# Patient Record
Sex: Female | Born: 1935 | Race: White | Hispanic: No | Smoking: Never smoker
Health system: Southern US, Community
[De-identification: ages and names within clinical notes are randomized; demographics above are authoritative.]

## PROBLEM LIST (undated history)

## (undated) DIAGNOSIS — N390 Urinary tract infection, site not specified: Secondary | ICD-10-CM

## (undated) DIAGNOSIS — J449 Chronic obstructive pulmonary disease, unspecified: Secondary | ICD-10-CM

## (undated) DIAGNOSIS — R55 Syncope and collapse: Secondary | ICD-10-CM

## (undated) DIAGNOSIS — I10 Essential (primary) hypertension: Secondary | ICD-10-CM

## (undated) DIAGNOSIS — R918 Other nonspecific abnormal finding of lung field: Secondary | ICD-10-CM

## (undated) DIAGNOSIS — R001 Bradycardia, unspecified: Secondary | ICD-10-CM

## (undated) DIAGNOSIS — R0600 Dyspnea, unspecified: Secondary | ICD-10-CM

## (undated) DIAGNOSIS — R531 Weakness: Secondary | ICD-10-CM

## (undated) DIAGNOSIS — K219 Gastro-esophageal reflux disease without esophagitis: Secondary | ICD-10-CM

## (undated) DIAGNOSIS — F0390 Unspecified dementia without behavioral disturbance: Secondary | ICD-10-CM

## (undated) DIAGNOSIS — E119 Type 2 diabetes mellitus without complications: Secondary | ICD-10-CM

## (undated) DIAGNOSIS — I4891 Unspecified atrial fibrillation: Secondary | ICD-10-CM

## (undated) DIAGNOSIS — M1990 Unspecified osteoarthritis, unspecified site: Secondary | ICD-10-CM

## (undated) DIAGNOSIS — D72810 Lymphocytopenia: Secondary | ICD-10-CM

## (undated) HISTORY — PX: TONSILLECTOMY: SUR1361

## (undated) HISTORY — PX: CHOLECYSTECTOMY: SHX55

## (undated) HISTORY — PX: HYSTERECTOMY ABDOMINAL WITH SALPINGECTOMY: SHX6725

---

## 2011-03-08 DIAGNOSIS — M542 Cervicalgia: Secondary | ICD-10-CM | POA: Insufficient documentation

## 2011-03-08 DIAGNOSIS — M2550 Pain in unspecified joint: Secondary | ICD-10-CM | POA: Insufficient documentation

## 2011-03-08 DIAGNOSIS — M549 Dorsalgia, unspecified: Secondary | ICD-10-CM | POA: Insufficient documentation

## 2012-06-08 DIAGNOSIS — G894 Chronic pain syndrome: Secondary | ICD-10-CM | POA: Insufficient documentation

## 2012-06-08 DIAGNOSIS — Z79891 Long term (current) use of opiate analgesic: Secondary | ICD-10-CM | POA: Insufficient documentation

## 2015-04-17 DIAGNOSIS — M12561 Traumatic arthropathy, right knee: Secondary | ICD-10-CM | POA: Diagnosis not present

## 2015-04-25 DIAGNOSIS — R0902 Hypoxemia: Secondary | ICD-10-CM | POA: Diagnosis not present

## 2015-05-22 DIAGNOSIS — R079 Chest pain, unspecified: Secondary | ICD-10-CM | POA: Diagnosis not present

## 2015-05-22 DIAGNOSIS — R0789 Other chest pain: Secondary | ICD-10-CM | POA: Diagnosis not present

## 2015-05-22 DIAGNOSIS — R0602 Shortness of breath: Secondary | ICD-10-CM | POA: Diagnosis not present

## 2015-05-26 DIAGNOSIS — R0902 Hypoxemia: Secondary | ICD-10-CM | POA: Diagnosis not present

## 2015-05-27 DIAGNOSIS — K219 Gastro-esophageal reflux disease without esophagitis: Secondary | ICD-10-CM | POA: Diagnosis not present

## 2015-05-27 DIAGNOSIS — Z23 Encounter for immunization: Secondary | ICD-10-CM | POA: Diagnosis not present

## 2015-05-27 DIAGNOSIS — F0390 Unspecified dementia without behavioral disturbance: Secondary | ICD-10-CM | POA: Diagnosis not present

## 2015-06-23 DIAGNOSIS — R0902 Hypoxemia: Secondary | ICD-10-CM | POA: Diagnosis not present

## 2015-07-24 DIAGNOSIS — R0902 Hypoxemia: Secondary | ICD-10-CM | POA: Diagnosis not present

## 2015-08-23 DIAGNOSIS — R0902 Hypoxemia: Secondary | ICD-10-CM | POA: Diagnosis not present

## 2015-09-04 DIAGNOSIS — M7501 Adhesive capsulitis of right shoulder: Secondary | ICD-10-CM | POA: Diagnosis not present

## 2015-09-04 DIAGNOSIS — M7502 Adhesive capsulitis of left shoulder: Secondary | ICD-10-CM | POA: Diagnosis not present

## 2015-09-04 DIAGNOSIS — Z79899 Other long term (current) drug therapy: Secondary | ICD-10-CM | POA: Diagnosis not present

## 2015-09-04 DIAGNOSIS — E1140 Type 2 diabetes mellitus with diabetic neuropathy, unspecified: Secondary | ICD-10-CM | POA: Diagnosis not present

## 2015-09-18 DIAGNOSIS — M19012 Primary osteoarthritis, left shoulder: Secondary | ICD-10-CM | POA: Diagnosis not present

## 2015-09-23 DIAGNOSIS — R0902 Hypoxemia: Secondary | ICD-10-CM | POA: Diagnosis not present

## 2015-09-23 DIAGNOSIS — H26492 Other secondary cataract, left eye: Secondary | ICD-10-CM | POA: Diagnosis not present

## 2015-09-23 DIAGNOSIS — H53001 Unspecified amblyopia, right eye: Secondary | ICD-10-CM | POA: Diagnosis not present

## 2015-09-23 DIAGNOSIS — H25811 Combined forms of age-related cataract, right eye: Secondary | ICD-10-CM | POA: Diagnosis not present

## 2015-10-16 DIAGNOSIS — M170 Bilateral primary osteoarthritis of knee: Secondary | ICD-10-CM | POA: Diagnosis not present

## 2015-10-16 DIAGNOSIS — M19012 Primary osteoarthritis, left shoulder: Secondary | ICD-10-CM | POA: Diagnosis not present

## 2015-10-20 DIAGNOSIS — M4800 Spinal stenosis, site unspecified: Secondary | ICD-10-CM | POA: Diagnosis not present

## 2015-10-20 DIAGNOSIS — R1013 Epigastric pain: Secondary | ICD-10-CM | POA: Diagnosis not present

## 2015-10-20 DIAGNOSIS — Z1389 Encounter for screening for other disorder: Secondary | ICD-10-CM | POA: Diagnosis not present

## 2015-10-20 DIAGNOSIS — M159 Polyosteoarthritis, unspecified: Secondary | ICD-10-CM | POA: Diagnosis not present

## 2015-10-20 DIAGNOSIS — G8929 Other chronic pain: Secondary | ICD-10-CM | POA: Diagnosis not present

## 2015-10-23 DIAGNOSIS — R0902 Hypoxemia: Secondary | ICD-10-CM | POA: Diagnosis not present

## 2015-11-05 DIAGNOSIS — K219 Gastro-esophageal reflux disease without esophagitis: Secondary | ICD-10-CM | POA: Diagnosis not present

## 2015-11-23 DIAGNOSIS — R0902 Hypoxemia: Secondary | ICD-10-CM | POA: Diagnosis not present

## 2015-12-04 DIAGNOSIS — K219 Gastro-esophageal reflux disease without esophagitis: Secondary | ICD-10-CM | POA: Diagnosis not present

## 2015-12-09 DIAGNOSIS — Z0000 Encounter for general adult medical examination without abnormal findings: Secondary | ICD-10-CM | POA: Diagnosis not present

## 2015-12-09 DIAGNOSIS — M159 Polyosteoarthritis, unspecified: Secondary | ICD-10-CM | POA: Diagnosis not present

## 2015-12-09 DIAGNOSIS — R001 Bradycardia, unspecified: Secondary | ICD-10-CM | POA: Diagnosis not present

## 2015-12-24 DIAGNOSIS — R0902 Hypoxemia: Secondary | ICD-10-CM | POA: Diagnosis not present

## 2015-12-26 DIAGNOSIS — I10 Essential (primary) hypertension: Secondary | ICD-10-CM | POA: Diagnosis not present

## 2015-12-26 DIAGNOSIS — R001 Bradycardia, unspecified: Secondary | ICD-10-CM | POA: Diagnosis not present

## 2015-12-26 DIAGNOSIS — R42 Dizziness and giddiness: Secondary | ICD-10-CM | POA: Insufficient documentation

## 2015-12-29 DIAGNOSIS — R1084 Generalized abdominal pain: Secondary | ICD-10-CM | POA: Diagnosis not present

## 2015-12-29 DIAGNOSIS — Z79899 Other long term (current) drug therapy: Secondary | ICD-10-CM | POA: Diagnosis not present

## 2015-12-29 DIAGNOSIS — I10 Essential (primary) hypertension: Secondary | ICD-10-CM | POA: Diagnosis not present

## 2015-12-29 DIAGNOSIS — Z8679 Personal history of other diseases of the circulatory system: Secondary | ICD-10-CM | POA: Diagnosis not present

## 2015-12-29 DIAGNOSIS — K5730 Diverticulosis of large intestine without perforation or abscess without bleeding: Secondary | ICD-10-CM | POA: Diagnosis not present

## 2015-12-29 DIAGNOSIS — R109 Unspecified abdominal pain: Secondary | ICD-10-CM | POA: Diagnosis not present

## 2016-01-06 DIAGNOSIS — R42 Dizziness and giddiness: Secondary | ICD-10-CM | POA: Diagnosis not present

## 2016-01-06 DIAGNOSIS — I10 Essential (primary) hypertension: Secondary | ICD-10-CM | POA: Diagnosis not present

## 2016-01-06 DIAGNOSIS — R001 Bradycardia, unspecified: Secondary | ICD-10-CM | POA: Diagnosis not present

## 2016-01-08 DIAGNOSIS — R42 Dizziness and giddiness: Secondary | ICD-10-CM | POA: Diagnosis not present

## 2016-01-08 DIAGNOSIS — R001 Bradycardia, unspecified: Secondary | ICD-10-CM | POA: Diagnosis not present

## 2016-01-08 DIAGNOSIS — I10 Essential (primary) hypertension: Secondary | ICD-10-CM | POA: Diagnosis not present

## 2016-01-23 DIAGNOSIS — R0902 Hypoxemia: Secondary | ICD-10-CM | POA: Diagnosis not present

## 2016-01-29 DIAGNOSIS — R531 Weakness: Secondary | ICD-10-CM | POA: Diagnosis not present

## 2016-01-29 DIAGNOSIS — J189 Pneumonia, unspecified organism: Secondary | ICD-10-CM | POA: Diagnosis not present

## 2016-01-29 DIAGNOSIS — R0602 Shortness of breath: Secondary | ICD-10-CM | POA: Diagnosis not present

## 2016-01-29 DIAGNOSIS — R0902 Hypoxemia: Secondary | ICD-10-CM | POA: Diagnosis not present

## 2016-01-29 DIAGNOSIS — N390 Urinary tract infection, site not specified: Secondary | ICD-10-CM | POA: Diagnosis not present

## 2016-01-29 DIAGNOSIS — R05 Cough: Secondary | ICD-10-CM | POA: Diagnosis not present

## 2016-02-12 DIAGNOSIS — M159 Polyosteoarthritis, unspecified: Secondary | ICD-10-CM | POA: Diagnosis not present

## 2016-02-12 DIAGNOSIS — M4800 Spinal stenosis, site unspecified: Secondary | ICD-10-CM | POA: Diagnosis not present

## 2016-02-23 DIAGNOSIS — R0902 Hypoxemia: Secondary | ICD-10-CM | POA: Diagnosis not present

## 2016-03-16 DIAGNOSIS — H26492 Other secondary cataract, left eye: Secondary | ICD-10-CM | POA: Diagnosis not present

## 2016-03-16 DIAGNOSIS — H25811 Combined forms of age-related cataract, right eye: Secondary | ICD-10-CM | POA: Diagnosis not present

## 2016-03-24 DIAGNOSIS — R0902 Hypoxemia: Secondary | ICD-10-CM | POA: Diagnosis not present

## 2016-04-24 DIAGNOSIS — R0902 Hypoxemia: Secondary | ICD-10-CM | POA: Diagnosis not present

## 2016-05-12 DIAGNOSIS — M159 Polyosteoarthritis, unspecified: Secondary | ICD-10-CM | POA: Diagnosis not present

## 2016-05-12 DIAGNOSIS — M4800 Spinal stenosis, site unspecified: Secondary | ICD-10-CM | POA: Diagnosis not present

## 2016-05-12 DIAGNOSIS — G8929 Other chronic pain: Secondary | ICD-10-CM | POA: Diagnosis not present

## 2016-05-12 DIAGNOSIS — R52 Pain, unspecified: Secondary | ICD-10-CM | POA: Diagnosis not present

## 2016-05-12 DIAGNOSIS — Z79899 Other long term (current) drug therapy: Secondary | ICD-10-CM | POA: Diagnosis not present

## 2016-05-25 DIAGNOSIS — R0902 Hypoxemia: Secondary | ICD-10-CM | POA: Diagnosis not present

## 2016-06-22 DIAGNOSIS — R0902 Hypoxemia: Secondary | ICD-10-CM | POA: Diagnosis not present

## 2016-07-23 DIAGNOSIS — R0902 Hypoxemia: Secondary | ICD-10-CM | POA: Diagnosis not present

## 2016-08-11 DIAGNOSIS — G8929 Other chronic pain: Secondary | ICD-10-CM | POA: Diagnosis not present

## 2016-08-11 DIAGNOSIS — R52 Pain, unspecified: Secondary | ICD-10-CM | POA: Diagnosis not present

## 2016-08-11 DIAGNOSIS — K921 Melena: Secondary | ICD-10-CM | POA: Diagnosis not present

## 2016-08-22 DIAGNOSIS — R0902 Hypoxemia: Secondary | ICD-10-CM | POA: Diagnosis not present

## 2016-09-14 DIAGNOSIS — H40001 Preglaucoma, unspecified, right eye: Secondary | ICD-10-CM | POA: Diagnosis not present

## 2016-09-14 DIAGNOSIS — H53001 Unspecified amblyopia, right eye: Secondary | ICD-10-CM | POA: Diagnosis not present

## 2016-09-14 DIAGNOSIS — H26492 Other secondary cataract, left eye: Secondary | ICD-10-CM | POA: Diagnosis not present

## 2016-09-14 DIAGNOSIS — H2589 Other age-related cataract: Secondary | ICD-10-CM | POA: Diagnosis not present

## 2016-09-22 DIAGNOSIS — R0902 Hypoxemia: Secondary | ICD-10-CM | POA: Diagnosis not present

## 2016-10-20 DIAGNOSIS — S300XXA Contusion of lower back and pelvis, initial encounter: Secondary | ICD-10-CM | POA: Diagnosis not present

## 2016-10-20 DIAGNOSIS — L299 Pruritus, unspecified: Secondary | ICD-10-CM | POA: Diagnosis not present

## 2016-10-22 DIAGNOSIS — R0902 Hypoxemia: Secondary | ICD-10-CM | POA: Diagnosis not present

## 2016-11-22 DIAGNOSIS — R0902 Hypoxemia: Secondary | ICD-10-CM | POA: Diagnosis not present

## 2016-12-23 DIAGNOSIS — R0902 Hypoxemia: Secondary | ICD-10-CM | POA: Diagnosis not present

## 2016-12-28 ENCOUNTER — Encounter: Payer: Self-pay | Admitting: Podiatry

## 2016-12-28 ENCOUNTER — Ambulatory Visit (INDEPENDENT_AMBULATORY_CARE_PROVIDER_SITE_OTHER): Payer: Medicare Other | Admitting: Podiatry

## 2016-12-28 VITALS — BP 153/78 | HR 49 | Ht 63.0 in | Wt 155.0 lb

## 2016-12-28 DIAGNOSIS — M2042 Other hammer toe(s) (acquired), left foot: Secondary | ICD-10-CM

## 2016-12-28 DIAGNOSIS — M79609 Pain in unspecified limb: Secondary | ICD-10-CM

## 2016-12-28 DIAGNOSIS — Z0189 Encounter for other specified special examinations: Secondary | ICD-10-CM

## 2016-12-28 DIAGNOSIS — E119 Type 2 diabetes mellitus without complications: Secondary | ICD-10-CM

## 2016-12-28 DIAGNOSIS — M2041 Other hammer toe(s) (acquired), right foot: Secondary | ICD-10-CM | POA: Diagnosis not present

## 2016-12-28 DIAGNOSIS — B351 Tinea unguium: Secondary | ICD-10-CM | POA: Diagnosis not present

## 2016-12-28 NOTE — Progress Notes (Signed)
   Subjective:    Patient ID: Erika Rice, female    DOB: 10-Mar-1925, 83 y.o.   MRN: [id number]  HPI  I am diabetic and am having a lot of burning in my feet they are cold during the day and hot at night.   Reports diabetes but does not really check sugars.  PMH of DM, Anxiety No past surgical history on file.  Current Outpatient Prescriptions:  .  ALPRAZolam (XANAX) 0.25 MG tablet, , Disp: , Rfl:  .  Cholecalciferol (VITAMIN D3) 1000 units CAPS, Take by mouth., Disp: , Rfl:  .  donepezil (ARICEPT) 10 MG tablet, , Disp: , Rfl:  .  escitalopram (LEXAPRO) 20 MG tablet, , Disp: , Rfl:  .  famotidine (PEPCID) 20 MG tablet, , Disp: , Rfl:  .  fentaNYL (DURAGESIC - DOSED MCG/HR) 50 MCG/HR, , Disp: , Rfl:  .  LYRICA 75 MG capsule, , Disp: , Rfl:  .  oxyCODONE-acetaminophen (PERCOCET) 7.5-325 MG tablet, , Disp: , Rfl:  .  pantoprazole (PROTONIX) 40 MG tablet, , Disp: , Rfl:  .  risperiDONE (RISPERDAL) 0.25 MG tablet, , Disp: , Rfl:  .  tiZANidine (ZANAFLEX) 2 MG tablet, , Disp: , Rfl:  .  vitamin B-12 (CYANOCOBALAMIN) 1000 MCG tablet, Take by mouth., Disp: , Rfl:   Allergies  Allergen Reactions  . Ace Inhibitors Other (See Comments)    Does not recall   . Sulfamethoxazole Rash and Other (See Comments)    Does not recall      Review of Systems  Endocrine: Positive for cold intolerance.  Musculoskeletal: Positive for arthralgias and gait problem.  Neurological: Positive for dizziness and weakness.  All other systems reviewed and are negative.      Objective:   Physical Exam Vitals:   12/28/16 1542  BP: (!) 153/78  Pulse: (!) 49   General AA&O x3. Normal mood and affect.  Vascular Dorsalis pedis and posterior tibial pulses  present 1+ bilaterally  Capillary refill normal to all digits. Pedal hair growth diminished.  Neurologic Epicritic sensation present bilaterally. Protective sensation with 5.07 monofilament  4/5 bilaterally. Vibratory sensation present bilaterally.    Dermatologic No open lesions. Interspaces clear of maceration.  Normal skin temperature and turgor. Hyperkeratotic lesions: none bilaterally. Nails: brittle, elongated  Orthopedic: No history of amputation. Normal lower extremity joint ROM without pain or crepitus. HAV deformity bilat. Lesser digital contractures bilat.      Assessment & Plan:  DM without Complication; neurovascularly Intact -Nails debrided as below d/t pain. Does not meet criteria for routine foot care. -Diabetic Risk Level 1  Hammertoes Bilat feet. -Silicone gel sleeve dispensed. -Continue extra depth diabetic shoes.  Procedure: Nail Debridement Rationale: pain in toenails Type of Debridement: manual, sharp debridement. Instrumentation: Nail nipper  Number of Nails: 10

## 2016-12-28 NOTE — Patient Instructions (Signed)

## 2017-01-04 DIAGNOSIS — Z23 Encounter for immunization: Secondary | ICD-10-CM | POA: Diagnosis not present

## 2017-01-22 DIAGNOSIS — R0902 Hypoxemia: Secondary | ICD-10-CM | POA: Diagnosis not present

## 2017-01-26 DIAGNOSIS — Z7689 Persons encountering health services in other specified circumstances: Secondary | ICD-10-CM | POA: Diagnosis not present

## 2017-01-26 DIAGNOSIS — M159 Polyosteoarthritis, unspecified: Secondary | ICD-10-CM | POA: Diagnosis not present

## 2017-02-10 DIAGNOSIS — R52 Pain, unspecified: Secondary | ICD-10-CM | POA: Diagnosis not present

## 2017-02-10 DIAGNOSIS — G894 Chronic pain syndrome: Secondary | ICD-10-CM | POA: Diagnosis not present

## 2017-02-22 DIAGNOSIS — R0902 Hypoxemia: Secondary | ICD-10-CM | POA: Diagnosis not present

## 2017-03-24 DIAGNOSIS — R0902 Hypoxemia: Secondary | ICD-10-CM | POA: Diagnosis not present

## 2017-04-04 DIAGNOSIS — R52 Pain, unspecified: Secondary | ICD-10-CM | POA: Diagnosis not present

## 2017-04-04 DIAGNOSIS — M159 Polyosteoarthritis, unspecified: Secondary | ICD-10-CM | POA: Diagnosis not present

## 2017-04-04 DIAGNOSIS — Z79899 Other long term (current) drug therapy: Secondary | ICD-10-CM | POA: Diagnosis not present

## 2017-04-04 DIAGNOSIS — G8929 Other chronic pain: Secondary | ICD-10-CM | POA: Diagnosis not present

## 2017-04-04 DIAGNOSIS — H6983 Other specified disorders of Eustachian tube, bilateral: Secondary | ICD-10-CM | POA: Diagnosis not present

## 2017-04-09 DIAGNOSIS — Z950 Presence of cardiac pacemaker: Secondary | ICD-10-CM | POA: Diagnosis not present

## 2017-04-09 DIAGNOSIS — Z79899 Other long term (current) drug therapy: Secondary | ICD-10-CM | POA: Diagnosis not present

## 2017-04-09 DIAGNOSIS — N390 Urinary tract infection, site not specified: Secondary | ICD-10-CM | POA: Diagnosis not present

## 2017-04-09 DIAGNOSIS — T404X5A Adverse effect of other synthetic narcotics, initial encounter: Secondary | ICD-10-CM | POA: Diagnosis not present

## 2017-04-09 DIAGNOSIS — Z5329 Procedure and treatment not carried out because of patient's decision for other reasons: Secondary | ICD-10-CM | POA: Diagnosis not present

## 2017-04-09 DIAGNOSIS — J449 Chronic obstructive pulmonary disease, unspecified: Secondary | ICD-10-CM | POA: Diagnosis not present

## 2017-04-09 DIAGNOSIS — R0789 Other chest pain: Secondary | ICD-10-CM | POA: Diagnosis not present

## 2017-04-09 DIAGNOSIS — G4700 Insomnia, unspecified: Secondary | ICD-10-CM | POA: Diagnosis not present

## 2017-04-09 DIAGNOSIS — Z87891 Personal history of nicotine dependence: Secondary | ICD-10-CM | POA: Diagnosis not present

## 2017-04-09 DIAGNOSIS — R001 Bradycardia, unspecified: Secondary | ICD-10-CM | POA: Diagnosis not present

## 2017-04-09 DIAGNOSIS — Z79891 Long term (current) use of opiate analgesic: Secondary | ICD-10-CM | POA: Diagnosis not present

## 2017-04-09 DIAGNOSIS — T441X5A Adverse effect of other parasympathomimetics [cholinergics], initial encounter: Secondary | ICD-10-CM | POA: Diagnosis not present

## 2017-04-09 DIAGNOSIS — N3000 Acute cystitis without hematuria: Secondary | ICD-10-CM | POA: Diagnosis not present

## 2017-04-09 DIAGNOSIS — E1142 Type 2 diabetes mellitus with diabetic polyneuropathy: Secondary | ICD-10-CM | POA: Diagnosis not present

## 2017-04-09 DIAGNOSIS — R404 Transient alteration of awareness: Secondary | ICD-10-CM | POA: Diagnosis not present

## 2017-04-09 DIAGNOSIS — G8929 Other chronic pain: Secondary | ICD-10-CM | POA: Diagnosis not present

## 2017-04-09 DIAGNOSIS — R5381 Other malaise: Secondary | ICD-10-CM | POA: Diagnosis not present

## 2017-04-09 DIAGNOSIS — I10 Essential (primary) hypertension: Secondary | ICD-10-CM | POA: Diagnosis not present

## 2017-04-09 DIAGNOSIS — E7800 Pure hypercholesterolemia, unspecified: Secondary | ICD-10-CM | POA: Diagnosis not present

## 2017-04-09 DIAGNOSIS — R531 Weakness: Secondary | ICD-10-CM | POA: Diagnosis not present

## 2017-04-09 DIAGNOSIS — E119 Type 2 diabetes mellitus without complications: Secondary | ICD-10-CM | POA: Diagnosis not present

## 2017-04-09 DIAGNOSIS — I452 Bifascicular block: Secondary | ICD-10-CM | POA: Diagnosis not present

## 2017-04-09 DIAGNOSIS — K219 Gastro-esophageal reflux disease without esophagitis: Secondary | ICD-10-CM | POA: Diagnosis not present

## 2017-04-09 DIAGNOSIS — I4891 Unspecified atrial fibrillation: Secondary | ICD-10-CM | POA: Diagnosis not present

## 2017-04-10 DIAGNOSIS — R001 Bradycardia, unspecified: Secondary | ICD-10-CM | POA: Diagnosis not present

## 2017-04-10 DIAGNOSIS — N390 Urinary tract infection, site not specified: Secondary | ICD-10-CM | POA: Diagnosis not present

## 2017-04-11 DIAGNOSIS — R001 Bradycardia, unspecified: Secondary | ICD-10-CM | POA: Diagnosis not present

## 2017-04-13 DIAGNOSIS — N390 Urinary tract infection, site not specified: Secondary | ICD-10-CM | POA: Diagnosis not present

## 2017-04-13 DIAGNOSIS — R001 Bradycardia, unspecified: Secondary | ICD-10-CM | POA: Diagnosis not present

## 2017-04-19 ENCOUNTER — Ambulatory Visit: Payer: Self-pay | Admitting: Cardiology

## 2017-04-21 DIAGNOSIS — I10 Essential (primary) hypertension: Secondary | ICD-10-CM | POA: Diagnosis not present

## 2017-04-21 DIAGNOSIS — M159 Polyosteoarthritis, unspecified: Secondary | ICD-10-CM | POA: Diagnosis not present

## 2017-04-21 DIAGNOSIS — R52 Pain, unspecified: Secondary | ICD-10-CM | POA: Diagnosis not present

## 2017-04-21 DIAGNOSIS — G8929 Other chronic pain: Secondary | ICD-10-CM | POA: Diagnosis not present

## 2017-04-24 DIAGNOSIS — R0902 Hypoxemia: Secondary | ICD-10-CM | POA: Diagnosis not present

## 2017-05-19 DIAGNOSIS — G8929 Other chronic pain: Secondary | ICD-10-CM | POA: Diagnosis not present

## 2017-05-19 DIAGNOSIS — R52 Pain, unspecified: Secondary | ICD-10-CM | POA: Diagnosis not present

## 2017-05-19 DIAGNOSIS — I10 Essential (primary) hypertension: Secondary | ICD-10-CM | POA: Diagnosis not present

## 2017-05-19 DIAGNOSIS — Z9181 History of falling: Secondary | ICD-10-CM | POA: Diagnosis not present

## 2017-05-25 DIAGNOSIS — R0902 Hypoxemia: Secondary | ICD-10-CM | POA: Diagnosis not present

## 2017-06-09 DIAGNOSIS — H2589 Other age-related cataract: Secondary | ICD-10-CM | POA: Diagnosis not present

## 2017-06-09 DIAGNOSIS — H26492 Other secondary cataract, left eye: Secondary | ICD-10-CM | POA: Diagnosis not present

## 2017-06-10 DIAGNOSIS — R600 Localized edema: Secondary | ICD-10-CM | POA: Diagnosis not present

## 2017-06-10 DIAGNOSIS — I10 Essential (primary) hypertension: Secondary | ICD-10-CM | POA: Diagnosis not present

## 2017-06-16 DIAGNOSIS — I10 Essential (primary) hypertension: Secondary | ICD-10-CM | POA: Diagnosis not present

## 2017-06-16 DIAGNOSIS — M159 Polyosteoarthritis, unspecified: Secondary | ICD-10-CM | POA: Diagnosis not present

## 2017-06-16 DIAGNOSIS — R300 Dysuria: Secondary | ICD-10-CM | POA: Diagnosis not present

## 2017-06-22 DIAGNOSIS — R0902 Hypoxemia: Secondary | ICD-10-CM | POA: Diagnosis not present

## 2017-06-28 ENCOUNTER — Ambulatory Visit: Payer: Medicare Other | Admitting: Podiatry

## 2017-07-23 DIAGNOSIS — R0902 Hypoxemia: Secondary | ICD-10-CM | POA: Diagnosis not present

## 2017-08-04 DIAGNOSIS — Z0000 Encounter for general adult medical examination without abnormal findings: Secondary | ICD-10-CM | POA: Diagnosis not present

## 2017-08-04 DIAGNOSIS — R52 Pain, unspecified: Secondary | ICD-10-CM | POA: Diagnosis not present

## 2017-08-04 DIAGNOSIS — G8929 Other chronic pain: Secondary | ICD-10-CM | POA: Diagnosis not present

## 2017-08-09 DIAGNOSIS — S00419A Abrasion of unspecified ear, initial encounter: Secondary | ICD-10-CM | POA: Diagnosis not present

## 2017-08-09 DIAGNOSIS — H9203 Otalgia, bilateral: Secondary | ICD-10-CM | POA: Diagnosis not present

## 2017-08-22 DIAGNOSIS — R0902 Hypoxemia: Secondary | ICD-10-CM | POA: Diagnosis not present

## 2017-09-22 DIAGNOSIS — R0902 Hypoxemia: Secondary | ICD-10-CM | POA: Diagnosis not present

## 2017-10-22 DIAGNOSIS — R0902 Hypoxemia: Secondary | ICD-10-CM | POA: Diagnosis not present

## 2017-11-01 DIAGNOSIS — M7989 Other specified soft tissue disorders: Secondary | ICD-10-CM | POA: Diagnosis not present

## 2017-11-01 DIAGNOSIS — I5032 Chronic diastolic (congestive) heart failure: Secondary | ICD-10-CM | POA: Diagnosis not present

## 2017-11-01 DIAGNOSIS — R0600 Dyspnea, unspecified: Secondary | ICD-10-CM | POA: Diagnosis not present

## 2017-11-01 DIAGNOSIS — J189 Pneumonia, unspecified organism: Secondary | ICD-10-CM | POA: Diagnosis not present

## 2017-11-10 DIAGNOSIS — I10 Essential (primary) hypertension: Secondary | ICD-10-CM | POA: Diagnosis not present

## 2017-11-10 DIAGNOSIS — I5032 Chronic diastolic (congestive) heart failure: Secondary | ICD-10-CM | POA: Diagnosis not present

## 2017-11-22 DIAGNOSIS — R0902 Hypoxemia: Secondary | ICD-10-CM | POA: Diagnosis not present

## 2017-11-22 DIAGNOSIS — I10 Essential (primary) hypertension: Secondary | ICD-10-CM | POA: Diagnosis not present

## 2017-11-22 DIAGNOSIS — G8929 Other chronic pain: Secondary | ICD-10-CM | POA: Diagnosis not present

## 2017-11-22 DIAGNOSIS — R52 Pain, unspecified: Secondary | ICD-10-CM | POA: Diagnosis not present

## 2017-11-22 DIAGNOSIS — M159 Polyosteoarthritis, unspecified: Secondary | ICD-10-CM | POA: Diagnosis not present

## 2017-12-22 DIAGNOSIS — R1312 Dysphagia, oropharyngeal phase: Secondary | ICD-10-CM | POA: Diagnosis not present

## 2017-12-22 DIAGNOSIS — M792 Neuralgia and neuritis, unspecified: Secondary | ICD-10-CM | POA: Diagnosis not present

## 2017-12-23 DIAGNOSIS — R0902 Hypoxemia: Secondary | ICD-10-CM | POA: Diagnosis not present

## 2018-01-12 DIAGNOSIS — R1310 Dysphagia, unspecified: Secondary | ICD-10-CM | POA: Diagnosis not present

## 2018-01-20 DIAGNOSIS — R1310 Dysphagia, unspecified: Secondary | ICD-10-CM | POA: Diagnosis not present

## 2018-01-20 DIAGNOSIS — Z23 Encounter for immunization: Secondary | ICD-10-CM | POA: Diagnosis not present

## 2018-01-20 DIAGNOSIS — K228 Other specified diseases of esophagus: Secondary | ICD-10-CM | POA: Diagnosis not present

## 2018-01-20 DIAGNOSIS — K449 Diaphragmatic hernia without obstruction or gangrene: Secondary | ICD-10-CM | POA: Diagnosis not present

## 2018-01-22 DIAGNOSIS — R0902 Hypoxemia: Secondary | ICD-10-CM | POA: Diagnosis not present

## 2018-01-31 DIAGNOSIS — H2589 Other age-related cataract: Secondary | ICD-10-CM | POA: Diagnosis not present

## 2018-02-09 DIAGNOSIS — B372 Candidiasis of skin and nail: Secondary | ICD-10-CM | POA: Diagnosis not present

## 2018-02-18 DIAGNOSIS — B372 Candidiasis of skin and nail: Secondary | ICD-10-CM | POA: Diagnosis not present

## 2018-02-18 DIAGNOSIS — M159 Polyosteoarthritis, unspecified: Secondary | ICD-10-CM | POA: Diagnosis not present

## 2018-02-18 DIAGNOSIS — E7800 Pure hypercholesterolemia, unspecified: Secondary | ICD-10-CM | POA: Diagnosis not present

## 2018-02-18 DIAGNOSIS — I10 Essential (primary) hypertension: Secondary | ICD-10-CM | POA: Diagnosis not present

## 2018-02-18 DIAGNOSIS — Z79891 Long term (current) use of opiate analgesic: Secondary | ICD-10-CM | POA: Diagnosis not present

## 2018-02-18 DIAGNOSIS — Z7902 Long term (current) use of antithrombotics/antiplatelets: Secondary | ICD-10-CM | POA: Diagnosis not present

## 2018-02-18 DIAGNOSIS — M5135 Other intervertebral disc degeneration, thoracolumbar region: Secondary | ICD-10-CM | POA: Diagnosis not present

## 2018-02-18 DIAGNOSIS — M48061 Spinal stenosis, lumbar region without neurogenic claudication: Secondary | ICD-10-CM | POA: Diagnosis not present

## 2018-02-18 DIAGNOSIS — Z9981 Dependence on supplemental oxygen: Secondary | ICD-10-CM | POA: Diagnosis not present

## 2018-02-18 DIAGNOSIS — E119 Type 2 diabetes mellitus without complications: Secondary | ICD-10-CM | POA: Diagnosis not present

## 2018-02-20 DIAGNOSIS — I10 Essential (primary) hypertension: Secondary | ICD-10-CM | POA: Diagnosis not present

## 2018-02-20 DIAGNOSIS — M48061 Spinal stenosis, lumbar region without neurogenic claudication: Secondary | ICD-10-CM | POA: Diagnosis not present

## 2018-02-20 DIAGNOSIS — Z9981 Dependence on supplemental oxygen: Secondary | ICD-10-CM | POA: Diagnosis not present

## 2018-02-20 DIAGNOSIS — Z7902 Long term (current) use of antithrombotics/antiplatelets: Secondary | ICD-10-CM | POA: Diagnosis not present

## 2018-02-20 DIAGNOSIS — E119 Type 2 diabetes mellitus without complications: Secondary | ICD-10-CM | POA: Diagnosis not present

## 2018-02-20 DIAGNOSIS — M159 Polyosteoarthritis, unspecified: Secondary | ICD-10-CM | POA: Diagnosis not present

## 2018-02-20 DIAGNOSIS — E7800 Pure hypercholesterolemia, unspecified: Secondary | ICD-10-CM | POA: Diagnosis not present

## 2018-02-20 DIAGNOSIS — B372 Candidiasis of skin and nail: Secondary | ICD-10-CM | POA: Diagnosis not present

## 2018-02-20 DIAGNOSIS — M5135 Other intervertebral disc degeneration, thoracolumbar region: Secondary | ICD-10-CM | POA: Diagnosis not present

## 2018-02-20 DIAGNOSIS — Z79891 Long term (current) use of opiate analgesic: Secondary | ICD-10-CM | POA: Diagnosis not present

## 2018-02-21 DIAGNOSIS — Z7902 Long term (current) use of antithrombotics/antiplatelets: Secondary | ICD-10-CM | POA: Diagnosis not present

## 2018-02-21 DIAGNOSIS — Z9981 Dependence on supplemental oxygen: Secondary | ICD-10-CM | POA: Diagnosis not present

## 2018-02-21 DIAGNOSIS — E7800 Pure hypercholesterolemia, unspecified: Secondary | ICD-10-CM | POA: Diagnosis not present

## 2018-02-21 DIAGNOSIS — M159 Polyosteoarthritis, unspecified: Secondary | ICD-10-CM | POA: Diagnosis not present

## 2018-02-21 DIAGNOSIS — M48061 Spinal stenosis, lumbar region without neurogenic claudication: Secondary | ICD-10-CM | POA: Diagnosis not present

## 2018-02-21 DIAGNOSIS — I10 Essential (primary) hypertension: Secondary | ICD-10-CM | POA: Diagnosis not present

## 2018-02-21 DIAGNOSIS — M5135 Other intervertebral disc degeneration, thoracolumbar region: Secondary | ICD-10-CM | POA: Diagnosis not present

## 2018-02-21 DIAGNOSIS — E119 Type 2 diabetes mellitus without complications: Secondary | ICD-10-CM | POA: Diagnosis not present

## 2018-02-21 DIAGNOSIS — Z79891 Long term (current) use of opiate analgesic: Secondary | ICD-10-CM | POA: Diagnosis not present

## 2018-02-21 DIAGNOSIS — B372 Candidiasis of skin and nail: Secondary | ICD-10-CM | POA: Diagnosis not present

## 2018-02-22 DIAGNOSIS — R0902 Hypoxemia: Secondary | ICD-10-CM | POA: Diagnosis not present

## 2018-02-27 DIAGNOSIS — Z79891 Long term (current) use of opiate analgesic: Secondary | ICD-10-CM | POA: Diagnosis not present

## 2018-02-27 DIAGNOSIS — M5135 Other intervertebral disc degeneration, thoracolumbar region: Secondary | ICD-10-CM | POA: Diagnosis not present

## 2018-02-27 DIAGNOSIS — M48061 Spinal stenosis, lumbar region without neurogenic claudication: Secondary | ICD-10-CM | POA: Diagnosis not present

## 2018-02-27 DIAGNOSIS — B372 Candidiasis of skin and nail: Secondary | ICD-10-CM | POA: Diagnosis not present

## 2018-02-27 DIAGNOSIS — M159 Polyosteoarthritis, unspecified: Secondary | ICD-10-CM | POA: Diagnosis not present

## 2018-02-27 DIAGNOSIS — E119 Type 2 diabetes mellitus without complications: Secondary | ICD-10-CM | POA: Diagnosis not present

## 2018-02-27 DIAGNOSIS — Z9981 Dependence on supplemental oxygen: Secondary | ICD-10-CM | POA: Diagnosis not present

## 2018-02-27 DIAGNOSIS — Z7902 Long term (current) use of antithrombotics/antiplatelets: Secondary | ICD-10-CM | POA: Diagnosis not present

## 2018-02-27 DIAGNOSIS — E7800 Pure hypercholesterolemia, unspecified: Secondary | ICD-10-CM | POA: Diagnosis not present

## 2018-02-27 DIAGNOSIS — I10 Essential (primary) hypertension: Secondary | ICD-10-CM | POA: Diagnosis not present

## 2018-03-01 DIAGNOSIS — E7800 Pure hypercholesterolemia, unspecified: Secondary | ICD-10-CM | POA: Diagnosis not present

## 2018-03-01 DIAGNOSIS — M159 Polyosteoarthritis, unspecified: Secondary | ICD-10-CM | POA: Diagnosis not present

## 2018-03-01 DIAGNOSIS — M5135 Other intervertebral disc degeneration, thoracolumbar region: Secondary | ICD-10-CM | POA: Diagnosis not present

## 2018-03-01 DIAGNOSIS — Z9981 Dependence on supplemental oxygen: Secondary | ICD-10-CM | POA: Diagnosis not present

## 2018-03-01 DIAGNOSIS — Z7902 Long term (current) use of antithrombotics/antiplatelets: Secondary | ICD-10-CM | POA: Diagnosis not present

## 2018-03-01 DIAGNOSIS — E119 Type 2 diabetes mellitus without complications: Secondary | ICD-10-CM | POA: Diagnosis not present

## 2018-03-01 DIAGNOSIS — I10 Essential (primary) hypertension: Secondary | ICD-10-CM | POA: Diagnosis not present

## 2018-03-01 DIAGNOSIS — M48061 Spinal stenosis, lumbar region without neurogenic claudication: Secondary | ICD-10-CM | POA: Diagnosis not present

## 2018-03-01 DIAGNOSIS — B372 Candidiasis of skin and nail: Secondary | ICD-10-CM | POA: Diagnosis not present

## 2018-03-01 DIAGNOSIS — Z79891 Long term (current) use of opiate analgesic: Secondary | ICD-10-CM | POA: Diagnosis not present

## 2018-03-02 DIAGNOSIS — E119 Type 2 diabetes mellitus without complications: Secondary | ICD-10-CM | POA: Diagnosis not present

## 2018-03-02 DIAGNOSIS — Z9981 Dependence on supplemental oxygen: Secondary | ICD-10-CM | POA: Diagnosis not present

## 2018-03-02 DIAGNOSIS — B372 Candidiasis of skin and nail: Secondary | ICD-10-CM | POA: Diagnosis not present

## 2018-03-02 DIAGNOSIS — I10 Essential (primary) hypertension: Secondary | ICD-10-CM | POA: Diagnosis not present

## 2018-03-02 DIAGNOSIS — Z7902 Long term (current) use of antithrombotics/antiplatelets: Secondary | ICD-10-CM | POA: Diagnosis not present

## 2018-03-02 DIAGNOSIS — M159 Polyosteoarthritis, unspecified: Secondary | ICD-10-CM | POA: Diagnosis not present

## 2018-03-02 DIAGNOSIS — Z79891 Long term (current) use of opiate analgesic: Secondary | ICD-10-CM | POA: Diagnosis not present

## 2018-03-02 DIAGNOSIS — M48061 Spinal stenosis, lumbar region without neurogenic claudication: Secondary | ICD-10-CM | POA: Diagnosis not present

## 2018-03-02 DIAGNOSIS — E7800 Pure hypercholesterolemia, unspecified: Secondary | ICD-10-CM | POA: Diagnosis not present

## 2018-03-02 DIAGNOSIS — M5135 Other intervertebral disc degeneration, thoracolumbar region: Secondary | ICD-10-CM | POA: Diagnosis not present

## 2018-03-03 DIAGNOSIS — Z9981 Dependence on supplemental oxygen: Secondary | ICD-10-CM | POA: Diagnosis not present

## 2018-03-03 DIAGNOSIS — M159 Polyosteoarthritis, unspecified: Secondary | ICD-10-CM | POA: Diagnosis not present

## 2018-03-03 DIAGNOSIS — M48061 Spinal stenosis, lumbar region without neurogenic claudication: Secondary | ICD-10-CM | POA: Diagnosis not present

## 2018-03-03 DIAGNOSIS — E119 Type 2 diabetes mellitus without complications: Secondary | ICD-10-CM | POA: Diagnosis not present

## 2018-03-03 DIAGNOSIS — I10 Essential (primary) hypertension: Secondary | ICD-10-CM | POA: Diagnosis not present

## 2018-03-03 DIAGNOSIS — Z7902 Long term (current) use of antithrombotics/antiplatelets: Secondary | ICD-10-CM | POA: Diagnosis not present

## 2018-03-03 DIAGNOSIS — M5135 Other intervertebral disc degeneration, thoracolumbar region: Secondary | ICD-10-CM | POA: Diagnosis not present

## 2018-03-03 DIAGNOSIS — E7800 Pure hypercholesterolemia, unspecified: Secondary | ICD-10-CM | POA: Diagnosis not present

## 2018-03-03 DIAGNOSIS — Z79891 Long term (current) use of opiate analgesic: Secondary | ICD-10-CM | POA: Diagnosis not present

## 2018-03-03 DIAGNOSIS — B372 Candidiasis of skin and nail: Secondary | ICD-10-CM | POA: Diagnosis not present

## 2018-03-07 DIAGNOSIS — E119 Type 2 diabetes mellitus without complications: Secondary | ICD-10-CM | POA: Diagnosis not present

## 2018-03-07 DIAGNOSIS — E7800 Pure hypercholesterolemia, unspecified: Secondary | ICD-10-CM | POA: Diagnosis not present

## 2018-03-07 DIAGNOSIS — M48061 Spinal stenosis, lumbar region without neurogenic claudication: Secondary | ICD-10-CM | POA: Diagnosis not present

## 2018-03-07 DIAGNOSIS — I10 Essential (primary) hypertension: Secondary | ICD-10-CM | POA: Diagnosis not present

## 2018-03-07 DIAGNOSIS — M159 Polyosteoarthritis, unspecified: Secondary | ICD-10-CM | POA: Diagnosis not present

## 2018-03-07 DIAGNOSIS — B372 Candidiasis of skin and nail: Secondary | ICD-10-CM | POA: Diagnosis not present

## 2018-03-07 DIAGNOSIS — Z7902 Long term (current) use of antithrombotics/antiplatelets: Secondary | ICD-10-CM | POA: Diagnosis not present

## 2018-03-07 DIAGNOSIS — Z9981 Dependence on supplemental oxygen: Secondary | ICD-10-CM | POA: Diagnosis not present

## 2018-03-07 DIAGNOSIS — Z79891 Long term (current) use of opiate analgesic: Secondary | ICD-10-CM | POA: Diagnosis not present

## 2018-03-07 DIAGNOSIS — M5135 Other intervertebral disc degeneration, thoracolumbar region: Secondary | ICD-10-CM | POA: Diagnosis not present

## 2018-03-09 DIAGNOSIS — M5135 Other intervertebral disc degeneration, thoracolumbar region: Secondary | ICD-10-CM | POA: Diagnosis not present

## 2018-03-09 DIAGNOSIS — Z7902 Long term (current) use of antithrombotics/antiplatelets: Secondary | ICD-10-CM | POA: Diagnosis not present

## 2018-03-09 DIAGNOSIS — Z79891 Long term (current) use of opiate analgesic: Secondary | ICD-10-CM | POA: Diagnosis not present

## 2018-03-09 DIAGNOSIS — E7800 Pure hypercholesterolemia, unspecified: Secondary | ICD-10-CM | POA: Diagnosis not present

## 2018-03-09 DIAGNOSIS — B372 Candidiasis of skin and nail: Secondary | ICD-10-CM | POA: Diagnosis not present

## 2018-03-09 DIAGNOSIS — M48061 Spinal stenosis, lumbar region without neurogenic claudication: Secondary | ICD-10-CM | POA: Diagnosis not present

## 2018-03-09 DIAGNOSIS — M159 Polyosteoarthritis, unspecified: Secondary | ICD-10-CM | POA: Diagnosis not present

## 2018-03-09 DIAGNOSIS — E119 Type 2 diabetes mellitus without complications: Secondary | ICD-10-CM | POA: Diagnosis not present

## 2018-03-09 DIAGNOSIS — Z9981 Dependence on supplemental oxygen: Secondary | ICD-10-CM | POA: Diagnosis not present

## 2018-03-09 DIAGNOSIS — I10 Essential (primary) hypertension: Secondary | ICD-10-CM | POA: Diagnosis not present

## 2018-03-10 DIAGNOSIS — Z79891 Long term (current) use of opiate analgesic: Secondary | ICD-10-CM | POA: Diagnosis not present

## 2018-03-10 DIAGNOSIS — M48061 Spinal stenosis, lumbar region without neurogenic claudication: Secondary | ICD-10-CM | POA: Diagnosis not present

## 2018-03-10 DIAGNOSIS — M159 Polyosteoarthritis, unspecified: Secondary | ICD-10-CM | POA: Diagnosis not present

## 2018-03-10 DIAGNOSIS — Z9981 Dependence on supplemental oxygen: Secondary | ICD-10-CM | POA: Diagnosis not present

## 2018-03-10 DIAGNOSIS — E7800 Pure hypercholesterolemia, unspecified: Secondary | ICD-10-CM | POA: Diagnosis not present

## 2018-03-10 DIAGNOSIS — Z7902 Long term (current) use of antithrombotics/antiplatelets: Secondary | ICD-10-CM | POA: Diagnosis not present

## 2018-03-10 DIAGNOSIS — E119 Type 2 diabetes mellitus without complications: Secondary | ICD-10-CM | POA: Diagnosis not present

## 2018-03-10 DIAGNOSIS — M5135 Other intervertebral disc degeneration, thoracolumbar region: Secondary | ICD-10-CM | POA: Diagnosis not present

## 2018-03-10 DIAGNOSIS — B372 Candidiasis of skin and nail: Secondary | ICD-10-CM | POA: Diagnosis not present

## 2018-03-10 DIAGNOSIS — I10 Essential (primary) hypertension: Secondary | ICD-10-CM | POA: Diagnosis not present

## 2018-03-14 DIAGNOSIS — I10 Essential (primary) hypertension: Secondary | ICD-10-CM | POA: Diagnosis not present

## 2018-03-14 DIAGNOSIS — Z9981 Dependence on supplemental oxygen: Secondary | ICD-10-CM | POA: Diagnosis not present

## 2018-03-14 DIAGNOSIS — Z7902 Long term (current) use of antithrombotics/antiplatelets: Secondary | ICD-10-CM | POA: Diagnosis not present

## 2018-03-14 DIAGNOSIS — E119 Type 2 diabetes mellitus without complications: Secondary | ICD-10-CM | POA: Diagnosis not present

## 2018-03-14 DIAGNOSIS — M48061 Spinal stenosis, lumbar region without neurogenic claudication: Secondary | ICD-10-CM | POA: Diagnosis not present

## 2018-03-14 DIAGNOSIS — Z79891 Long term (current) use of opiate analgesic: Secondary | ICD-10-CM | POA: Diagnosis not present

## 2018-03-14 DIAGNOSIS — E7800 Pure hypercholesterolemia, unspecified: Secondary | ICD-10-CM | POA: Diagnosis not present

## 2018-03-14 DIAGNOSIS — B372 Candidiasis of skin and nail: Secondary | ICD-10-CM | POA: Diagnosis not present

## 2018-03-14 DIAGNOSIS — M5135 Other intervertebral disc degeneration, thoracolumbar region: Secondary | ICD-10-CM | POA: Diagnosis not present

## 2018-03-14 DIAGNOSIS — M159 Polyosteoarthritis, unspecified: Secondary | ICD-10-CM | POA: Diagnosis not present

## 2018-03-16 DIAGNOSIS — B372 Candidiasis of skin and nail: Secondary | ICD-10-CM | POA: Diagnosis not present

## 2018-03-16 DIAGNOSIS — I10 Essential (primary) hypertension: Secondary | ICD-10-CM | POA: Diagnosis not present

## 2018-03-16 DIAGNOSIS — M159 Polyosteoarthritis, unspecified: Secondary | ICD-10-CM | POA: Diagnosis not present

## 2018-03-16 DIAGNOSIS — Z9981 Dependence on supplemental oxygen: Secondary | ICD-10-CM | POA: Diagnosis not present

## 2018-03-16 DIAGNOSIS — E7800 Pure hypercholesterolemia, unspecified: Secondary | ICD-10-CM | POA: Diagnosis not present

## 2018-03-16 DIAGNOSIS — E119 Type 2 diabetes mellitus without complications: Secondary | ICD-10-CM | POA: Diagnosis not present

## 2018-03-16 DIAGNOSIS — Z7902 Long term (current) use of antithrombotics/antiplatelets: Secondary | ICD-10-CM | POA: Diagnosis not present

## 2018-03-16 DIAGNOSIS — M5135 Other intervertebral disc degeneration, thoracolumbar region: Secondary | ICD-10-CM | POA: Diagnosis not present

## 2018-03-16 DIAGNOSIS — Z79891 Long term (current) use of opiate analgesic: Secondary | ICD-10-CM | POA: Diagnosis not present

## 2018-03-16 DIAGNOSIS — M48061 Spinal stenosis, lumbar region without neurogenic claudication: Secondary | ICD-10-CM | POA: Diagnosis not present

## 2018-03-17 DIAGNOSIS — E119 Type 2 diabetes mellitus without complications: Secondary | ICD-10-CM | POA: Diagnosis not present

## 2018-03-17 DIAGNOSIS — Z9981 Dependence on supplemental oxygen: Secondary | ICD-10-CM | POA: Diagnosis not present

## 2018-03-17 DIAGNOSIS — M159 Polyosteoarthritis, unspecified: Secondary | ICD-10-CM | POA: Diagnosis not present

## 2018-03-17 DIAGNOSIS — M5135 Other intervertebral disc degeneration, thoracolumbar region: Secondary | ICD-10-CM | POA: Diagnosis not present

## 2018-03-17 DIAGNOSIS — B372 Candidiasis of skin and nail: Secondary | ICD-10-CM | POA: Diagnosis not present

## 2018-03-17 DIAGNOSIS — Z79891 Long term (current) use of opiate analgesic: Secondary | ICD-10-CM | POA: Diagnosis not present

## 2018-03-17 DIAGNOSIS — Z7902 Long term (current) use of antithrombotics/antiplatelets: Secondary | ICD-10-CM | POA: Diagnosis not present

## 2018-03-17 DIAGNOSIS — M48061 Spinal stenosis, lumbar region without neurogenic claudication: Secondary | ICD-10-CM | POA: Diagnosis not present

## 2018-03-17 DIAGNOSIS — E7800 Pure hypercholesterolemia, unspecified: Secondary | ICD-10-CM | POA: Diagnosis not present

## 2018-03-17 DIAGNOSIS — I10 Essential (primary) hypertension: Secondary | ICD-10-CM | POA: Diagnosis not present

## 2018-03-20 DIAGNOSIS — E7800 Pure hypercholesterolemia, unspecified: Secondary | ICD-10-CM | POA: Diagnosis not present

## 2018-03-20 DIAGNOSIS — M48061 Spinal stenosis, lumbar region without neurogenic claudication: Secondary | ICD-10-CM | POA: Diagnosis not present

## 2018-03-20 DIAGNOSIS — Z79891 Long term (current) use of opiate analgesic: Secondary | ICD-10-CM | POA: Diagnosis not present

## 2018-03-20 DIAGNOSIS — M159 Polyosteoarthritis, unspecified: Secondary | ICD-10-CM | POA: Diagnosis not present

## 2018-03-20 DIAGNOSIS — I10 Essential (primary) hypertension: Secondary | ICD-10-CM | POA: Diagnosis not present

## 2018-03-20 DIAGNOSIS — M5135 Other intervertebral disc degeneration, thoracolumbar region: Secondary | ICD-10-CM | POA: Diagnosis not present

## 2018-03-20 DIAGNOSIS — Z7902 Long term (current) use of antithrombotics/antiplatelets: Secondary | ICD-10-CM | POA: Diagnosis not present

## 2018-03-20 DIAGNOSIS — B372 Candidiasis of skin and nail: Secondary | ICD-10-CM | POA: Diagnosis not present

## 2018-03-20 DIAGNOSIS — Z9981 Dependence on supplemental oxygen: Secondary | ICD-10-CM | POA: Diagnosis not present

## 2018-03-20 DIAGNOSIS — E119 Type 2 diabetes mellitus without complications: Secondary | ICD-10-CM | POA: Diagnosis not present

## 2018-03-24 DIAGNOSIS — R0902 Hypoxemia: Secondary | ICD-10-CM | POA: Diagnosis not present

## 2018-03-28 DIAGNOSIS — M48061 Spinal stenosis, lumbar region without neurogenic claudication: Secondary | ICD-10-CM | POA: Diagnosis not present

## 2018-03-28 DIAGNOSIS — E119 Type 2 diabetes mellitus without complications: Secondary | ICD-10-CM | POA: Diagnosis not present

## 2018-03-28 DIAGNOSIS — M5135 Other intervertebral disc degeneration, thoracolumbar region: Secondary | ICD-10-CM | POA: Diagnosis not present

## 2018-03-28 DIAGNOSIS — B372 Candidiasis of skin and nail: Secondary | ICD-10-CM | POA: Diagnosis not present

## 2018-03-28 DIAGNOSIS — Z79891 Long term (current) use of opiate analgesic: Secondary | ICD-10-CM | POA: Diagnosis not present

## 2018-03-28 DIAGNOSIS — E7800 Pure hypercholesterolemia, unspecified: Secondary | ICD-10-CM | POA: Diagnosis not present

## 2018-03-28 DIAGNOSIS — I10 Essential (primary) hypertension: Secondary | ICD-10-CM | POA: Diagnosis not present

## 2018-03-28 DIAGNOSIS — M159 Polyosteoarthritis, unspecified: Secondary | ICD-10-CM | POA: Diagnosis not present

## 2018-03-28 DIAGNOSIS — Z7902 Long term (current) use of antithrombotics/antiplatelets: Secondary | ICD-10-CM | POA: Diagnosis not present

## 2018-03-28 DIAGNOSIS — Z9981 Dependence on supplemental oxygen: Secondary | ICD-10-CM | POA: Diagnosis not present

## 2018-04-04 DIAGNOSIS — I10 Essential (primary) hypertension: Secondary | ICD-10-CM | POA: Diagnosis not present

## 2018-04-04 DIAGNOSIS — E119 Type 2 diabetes mellitus without complications: Secondary | ICD-10-CM | POA: Diagnosis not present

## 2018-04-04 DIAGNOSIS — B372 Candidiasis of skin and nail: Secondary | ICD-10-CM | POA: Diagnosis not present

## 2018-04-04 DIAGNOSIS — M159 Polyosteoarthritis, unspecified: Secondary | ICD-10-CM | POA: Diagnosis not present

## 2018-04-04 DIAGNOSIS — E7800 Pure hypercholesterolemia, unspecified: Secondary | ICD-10-CM | POA: Diagnosis not present

## 2018-04-04 DIAGNOSIS — M5135 Other intervertebral disc degeneration, thoracolumbar region: Secondary | ICD-10-CM | POA: Diagnosis not present

## 2018-04-04 DIAGNOSIS — Z7902 Long term (current) use of antithrombotics/antiplatelets: Secondary | ICD-10-CM | POA: Diagnosis not present

## 2018-04-04 DIAGNOSIS — Z9981 Dependence on supplemental oxygen: Secondary | ICD-10-CM | POA: Diagnosis not present

## 2018-04-04 DIAGNOSIS — Z79891 Long term (current) use of opiate analgesic: Secondary | ICD-10-CM | POA: Diagnosis not present

## 2018-04-04 DIAGNOSIS — M48061 Spinal stenosis, lumbar region without neurogenic claudication: Secondary | ICD-10-CM | POA: Diagnosis not present

## 2018-04-11 DIAGNOSIS — Z9981 Dependence on supplemental oxygen: Secondary | ICD-10-CM | POA: Diagnosis not present

## 2018-04-11 DIAGNOSIS — E7800 Pure hypercholesterolemia, unspecified: Secondary | ICD-10-CM | POA: Diagnosis not present

## 2018-04-11 DIAGNOSIS — M5135 Other intervertebral disc degeneration, thoracolumbar region: Secondary | ICD-10-CM | POA: Diagnosis not present

## 2018-04-11 DIAGNOSIS — I10 Essential (primary) hypertension: Secondary | ICD-10-CM | POA: Diagnosis not present

## 2018-04-11 DIAGNOSIS — B372 Candidiasis of skin and nail: Secondary | ICD-10-CM | POA: Diagnosis not present

## 2018-04-11 DIAGNOSIS — Z79891 Long term (current) use of opiate analgesic: Secondary | ICD-10-CM | POA: Diagnosis not present

## 2018-04-11 DIAGNOSIS — M159 Polyosteoarthritis, unspecified: Secondary | ICD-10-CM | POA: Diagnosis not present

## 2018-04-11 DIAGNOSIS — E119 Type 2 diabetes mellitus without complications: Secondary | ICD-10-CM | POA: Diagnosis not present

## 2018-04-11 DIAGNOSIS — M48061 Spinal stenosis, lumbar region without neurogenic claudication: Secondary | ICD-10-CM | POA: Diagnosis not present

## 2018-04-11 DIAGNOSIS — Z7902 Long term (current) use of antithrombotics/antiplatelets: Secondary | ICD-10-CM | POA: Diagnosis not present

## 2018-04-18 DIAGNOSIS — M159 Polyosteoarthritis, unspecified: Secondary | ICD-10-CM | POA: Diagnosis not present

## 2018-04-18 DIAGNOSIS — M48061 Spinal stenosis, lumbar region without neurogenic claudication: Secondary | ICD-10-CM | POA: Diagnosis not present

## 2018-04-18 DIAGNOSIS — M5135 Other intervertebral disc degeneration, thoracolumbar region: Secondary | ICD-10-CM | POA: Diagnosis not present

## 2018-04-18 DIAGNOSIS — Z7902 Long term (current) use of antithrombotics/antiplatelets: Secondary | ICD-10-CM | POA: Diagnosis not present

## 2018-04-18 DIAGNOSIS — E7800 Pure hypercholesterolemia, unspecified: Secondary | ICD-10-CM | POA: Diagnosis not present

## 2018-04-18 DIAGNOSIS — Z79891 Long term (current) use of opiate analgesic: Secondary | ICD-10-CM | POA: Diagnosis not present

## 2018-04-18 DIAGNOSIS — B372 Candidiasis of skin and nail: Secondary | ICD-10-CM | POA: Diagnosis not present

## 2018-04-18 DIAGNOSIS — E119 Type 2 diabetes mellitus without complications: Secondary | ICD-10-CM | POA: Diagnosis not present

## 2018-04-18 DIAGNOSIS — Z9981 Dependence on supplemental oxygen: Secondary | ICD-10-CM | POA: Diagnosis not present

## 2018-04-18 DIAGNOSIS — I10 Essential (primary) hypertension: Secondary | ICD-10-CM | POA: Diagnosis not present

## 2018-04-24 DIAGNOSIS — R0902 Hypoxemia: Secondary | ICD-10-CM | POA: Diagnosis not present

## 2018-05-25 DIAGNOSIS — R0902 Hypoxemia: Secondary | ICD-10-CM | POA: Diagnosis not present

## 2018-05-29 DIAGNOSIS — Z79899 Other long term (current) drug therapy: Secondary | ICD-10-CM | POA: Diagnosis not present

## 2018-05-29 DIAGNOSIS — E1140 Type 2 diabetes mellitus with diabetic neuropathy, unspecified: Secondary | ICD-10-CM | POA: Diagnosis not present

## 2018-05-29 DIAGNOSIS — M159 Polyosteoarthritis, unspecified: Secondary | ICD-10-CM | POA: Diagnosis not present

## 2018-06-23 DIAGNOSIS — R0902 Hypoxemia: Secondary | ICD-10-CM | POA: Diagnosis not present

## 2018-07-06 ENCOUNTER — Inpatient Hospital Stay (HOSPITAL_COMMUNITY)
Admission: AD | Admit: 2018-07-06 | Discharge: 2018-07-09 | DRG: 189 | Disposition: A | Discharge status: Home / Self Care | Payer: Medicare Other | Source: Other Acute Inpatient Hospital | Attending: Internal Medicine | Admitting: Internal Medicine

## 2018-07-06 DIAGNOSIS — Z79899 Other long term (current) drug therapy: Secondary | ICD-10-CM

## 2018-07-06 DIAGNOSIS — E860 Dehydration: Secondary | ICD-10-CM | POA: Diagnosis not present

## 2018-07-06 DIAGNOSIS — K529 Noninfective gastroenteritis and colitis, unspecified: Secondary | ICD-10-CM | POA: Diagnosis present

## 2018-07-06 DIAGNOSIS — Z809 Family history of malignant neoplasm, unspecified: Secondary | ICD-10-CM | POA: Diagnosis not present

## 2018-07-06 DIAGNOSIS — Z79891 Long term (current) use of opiate analgesic: Secondary | ICD-10-CM

## 2018-07-06 DIAGNOSIS — R52 Pain, unspecified: Secondary | ICD-10-CM | POA: Diagnosis not present

## 2018-07-06 DIAGNOSIS — Z833 Family history of diabetes mellitus: Secondary | ICD-10-CM

## 2018-07-06 DIAGNOSIS — J45909 Unspecified asthma, uncomplicated: Secondary | ICD-10-CM | POA: Diagnosis present

## 2018-07-06 DIAGNOSIS — K6289 Other specified diseases of anus and rectum: Secondary | ICD-10-CM | POA: Diagnosis not present

## 2018-07-06 DIAGNOSIS — M1990 Unspecified osteoarthritis, unspecified site: Secondary | ICD-10-CM | POA: Diagnosis not present

## 2018-07-06 DIAGNOSIS — J9601 Acute respiratory failure with hypoxia: Secondary | ICD-10-CM | POA: Diagnosis not present

## 2018-07-06 DIAGNOSIS — F418 Other specified anxiety disorders: Secondary | ICD-10-CM | POA: Diagnosis not present

## 2018-07-06 DIAGNOSIS — Z8249 Family history of ischemic heart disease and other diseases of the circulatory system: Secondary | ICD-10-CM

## 2018-07-06 DIAGNOSIS — R918 Other nonspecific abnormal finding of lung field: Secondary | ICD-10-CM | POA: Diagnosis not present

## 2018-07-06 DIAGNOSIS — Z20822 Contact with and (suspected) exposure to covid-19: Secondary | ICD-10-CM | POA: Diagnosis present

## 2018-07-06 DIAGNOSIS — R6889 Other general symptoms and signs: Secondary | ICD-10-CM

## 2018-07-06 DIAGNOSIS — K449 Diaphragmatic hernia without obstruction or gangrene: Secondary | ICD-10-CM | POA: Diagnosis not present

## 2018-07-06 DIAGNOSIS — D72810 Lymphocytopenia: Secondary | ICD-10-CM | POA: Diagnosis not present

## 2018-07-06 DIAGNOSIS — I700 Atherosclerosis of aorta: Secondary | ICD-10-CM | POA: Diagnosis not present

## 2018-07-06 DIAGNOSIS — R197 Diarrhea, unspecified: Secondary | ICD-10-CM | POA: Diagnosis not present

## 2018-07-06 DIAGNOSIS — F0390 Unspecified dementia without behavioral disturbance: Secondary | ICD-10-CM | POA: Diagnosis present

## 2018-07-06 DIAGNOSIS — I10 Essential (primary) hypertension: Secondary | ICD-10-CM | POA: Diagnosis present

## 2018-07-06 DIAGNOSIS — G8929 Other chronic pain: Secondary | ICD-10-CM | POA: Diagnosis not present

## 2018-07-06 DIAGNOSIS — J189 Pneumonia, unspecified organism: Secondary | ICD-10-CM | POA: Diagnosis not present

## 2018-07-06 DIAGNOSIS — R0902 Hypoxemia: Secondary | ICD-10-CM | POA: Diagnosis not present

## 2018-07-06 DIAGNOSIS — J181 Lobar pneumonia, unspecified organism: Secondary | ICD-10-CM | POA: Diagnosis not present

## 2018-07-06 DIAGNOSIS — F32A Depression, unspecified: Secondary | ICD-10-CM | POA: Diagnosis present

## 2018-07-06 DIAGNOSIS — N390 Urinary tract infection, site not specified: Secondary | ICD-10-CM | POA: Diagnosis not present

## 2018-07-06 DIAGNOSIS — J449 Chronic obstructive pulmonary disease, unspecified: Secondary | ICD-10-CM | POA: Diagnosis present

## 2018-07-06 DIAGNOSIS — R531 Weakness: Secondary | ICD-10-CM | POA: Diagnosis not present

## 2018-07-06 DIAGNOSIS — E1142 Type 2 diabetes mellitus with diabetic polyneuropathy: Secondary | ICD-10-CM | POA: Diagnosis not present

## 2018-07-06 DIAGNOSIS — F329 Major depressive disorder, single episode, unspecified: Secondary | ICD-10-CM | POA: Diagnosis present

## 2018-07-06 DIAGNOSIS — E876 Hypokalemia: Secondary | ICD-10-CM | POA: Diagnosis not present

## 2018-07-06 DIAGNOSIS — K219 Gastro-esophageal reflux disease without esophagitis: Secondary | ICD-10-CM | POA: Diagnosis present

## 2018-07-06 HISTORY — DX: Unspecified atrial fibrillation: I48.91

## 2018-07-06 HISTORY — DX: Unspecified dementia, unspecified severity, without behavioral disturbance, psychotic disturbance, mood disturbance, and anxiety: F03.90

## 2018-07-06 HISTORY — DX: Gastro-esophageal reflux disease without esophagitis: K21.9

## 2018-07-06 HISTORY — DX: Unspecified osteoarthritis, unspecified site: M19.90

## 2018-07-06 HISTORY — DX: Urinary tract infection, site not specified: N39.0

## 2018-07-06 HISTORY — DX: Type 2 diabetes mellitus without complications: E11.9

## 2018-07-06 HISTORY — DX: Other nonspecific abnormal finding of lung field: R91.8

## 2018-07-06 HISTORY — DX: Essential (primary) hypertension: I10

## 2018-07-06 HISTORY — DX: Syncope and collapse: R55

## 2018-07-06 HISTORY — DX: Lymphocytopenia: D72.810

## 2018-07-06 HISTORY — DX: Bradycardia, unspecified: R00.1

## 2018-07-06 HISTORY — DX: Chronic obstructive pulmonary disease, unspecified: J44.9

## 2018-07-06 HISTORY — DX: Dyspnea, unspecified: R06.00

## 2018-07-06 HISTORY — DX: Weakness: R53.1

## 2018-07-07 ENCOUNTER — Other Ambulatory Visit: Payer: Self-pay

## 2018-07-07 ENCOUNTER — Encounter (HOSPITAL_COMMUNITY): Payer: Self-pay

## 2018-07-07 DIAGNOSIS — R6889 Other general symptoms and signs: Secondary | ICD-10-CM

## 2018-07-07 DIAGNOSIS — F0390 Unspecified dementia without behavioral disturbance: Secondary | ICD-10-CM | POA: Diagnosis present

## 2018-07-07 DIAGNOSIS — K219 Gastro-esophageal reflux disease without esophagitis: Secondary | ICD-10-CM | POA: Diagnosis present

## 2018-07-07 DIAGNOSIS — F329 Major depressive disorder, single episode, unspecified: Secondary | ICD-10-CM | POA: Diagnosis present

## 2018-07-07 DIAGNOSIS — J9601 Acute respiratory failure with hypoxia: Principal | ICD-10-CM

## 2018-07-07 DIAGNOSIS — F32A Depression, unspecified: Secondary | ICD-10-CM | POA: Diagnosis present

## 2018-07-07 DIAGNOSIS — I10 Essential (primary) hypertension: Secondary | ICD-10-CM | POA: Diagnosis present

## 2018-07-07 DIAGNOSIS — Z20822 Contact with and (suspected) exposure to covid-19: Secondary | ICD-10-CM | POA: Diagnosis present

## 2018-07-07 DIAGNOSIS — J45909 Unspecified asthma, uncomplicated: Secondary | ICD-10-CM | POA: Diagnosis present

## 2018-07-07 LAB — BASIC METABOLIC PANEL
Anion gap: 9 (ref 5–15)
BUN: 12 mg/dL (ref 8–23)
CO2: 24 mmol/L (ref 22–32)
Calcium: 9.1 mg/dL (ref 8.9–10.3)
Chloride: 105 mmol/L (ref 98–111)
Creatinine, Ser: 0.73 mg/dL (ref 0.44–1.00)
GFR calc Af Amer: >60 mL/min (ref >60)
GFR calc non Af Amer: >60 mL/min (ref >60)
Glucose, Bld: 94 mg/dL (ref 70–99)
Potassium: 3.5 mmol/L (ref 3.5–5.1)
Sodium: 138 mmol/L (ref 135–145)

## 2018-07-07 LAB — CBC WITH DIFFERENTIAL/PLATELET
Abs Immature Granulocytes: 0.02 10*3/uL (ref 0.00–0.07)
Basophils Absolute: 0 10*3/uL (ref 0.0–0.1)
Basophils Relative: 0 %
Eosinophils Absolute: 0.3 10*3/uL (ref 0.0–0.5)
Eosinophils Relative: 4 %
HCT: 41.4 % (ref 36.0–46.0)
Hemoglobin: 12.9 g/dL (ref 12.0–15.0)
Immature Granulocytes: 0 %
Lymphocytes Relative: 14 %
Lymphs Abs: 1 10*3/uL (ref 0.7–4.0)
MCH: 31.6 pg (ref 26.0–34.0)
MCHC: 31.2 g/dL (ref 30.0–36.0)
MCV: 101.5 fL — ABNORMAL HIGH (ref 80.0–100.0)
Monocytes Absolute: 0.6 10*3/uL (ref 0.1–1.0)
Monocytes Relative: 8 %
Neutro Abs: 5.6 10*3/uL (ref 1.7–7.7)
Neutrophils Relative %: 74 %
Platelets: 190 10*3/uL (ref 150–400)
RBC: 4.08 MIL/uL (ref 3.87–5.11)
RDW: 12.4 % (ref 11.5–15.5)
WBC: 7.6 10*3/uL (ref 4.0–10.5)
nRBC: 0 % (ref 0.0–0.2)

## 2018-07-07 LAB — C-REACTIVE PROTEIN: CRP: 12.3 mg/dL — ABNORMAL HIGH (ref <1.0)

## 2018-07-07 LAB — HEPATIC FUNCTION PANEL
ALT: 9 U/L (ref 0–44)
AST: 18 U/L (ref 15–41)
Albumin: 3.4 g/dL — ABNORMAL LOW (ref 3.5–5.0)
Alkaline Phosphatase: 53 U/L (ref 38–126)
Bilirubin, Direct: 0.2 mg/dL (ref 0.0–0.2)
Indirect Bilirubin: 0.8 mg/dL (ref 0.3–0.9)
Total Bilirubin: 1 mg/dL (ref 0.3–1.2)
Total Protein: 6.3 g/dL — ABNORMAL LOW (ref 6.5–8.1)

## 2018-07-07 LAB — LACTIC ACID, PLASMA
Lactic Acid, Venous: 1.4 mmol/L (ref 0.5–1.9)
Lactic Acid, Venous: 0.7 mmol/L (ref 0.5–1.9)

## 2018-07-07 LAB — TROPONIN I: Troponin I: <0.03 ng/mL (ref <0.03)

## 2018-07-07 LAB — BRAIN NATRIURETIC PEPTIDE: B Natriuretic Peptide: 281.5 pg/mL — ABNORMAL HIGH (ref 0.0–100.0)

## 2018-07-07 MED ORDER — GABAPENTIN 400 MG PO CAPS
800.0000 mg | ORAL_CAPSULE | Freq: Three times a day (TID) | ORAL | Status: DC
  Administered 2018-07-07 – 2018-07-09 (×7): 800 mg via ORAL
  Filled 2018-07-07 (×8): qty 2

## 2018-07-07 MED ORDER — MECLIZINE HCL 25 MG PO TABS: 12.5000 mg | ORAL_TABLET | Freq: Four times a day (QID) | ORAL | Status: DC | PRN

## 2018-07-07 MED ORDER — PANTOPRAZOLE SODIUM 40 MG PO TBEC
40.0000 mg | DELAYED_RELEASE_TABLET | Freq: Every morning | ORAL | Status: DC
  Administered 2018-07-07 – 2018-07-09 (×3): 40 mg via ORAL
  Filled 2018-07-07 (×3): qty 1

## 2018-07-07 MED ORDER — MEMANTINE HCL 10 MG PO TABS
10.0000 mg | ORAL_TABLET | Freq: Two times a day (BID) | ORAL | Status: DC
  Administered 2018-07-07 – 2018-07-09 (×5): 10 mg via ORAL
  Filled 2018-07-07 (×5): qty 1

## 2018-07-07 MED ORDER — POTASSIUM CHLORIDE CRYS ER 20 MEQ PO TBCR
40.0000 meq | EXTENDED_RELEASE_TABLET | Freq: Once | ORAL | Status: AC
  Administered 2018-07-07: 40 meq via ORAL
  Filled 2018-07-07: qty 2

## 2018-07-07 MED ORDER — OXYCODONE-ACETAMINOPHEN 7.5-325 MG PO TABS
1.0000 | ORAL_TABLET | Freq: Three times a day (TID) | ORAL | Status: DC | PRN
  Administered 2018-07-07 – 2018-07-09 (×4): 1 via ORAL
  Filled 2018-07-07 (×4): qty 1

## 2018-07-07 MED ORDER — ONDANSETRON HCL 4 MG/2ML IJ SOLN: 4.0000 mg | Freq: Four times a day (QID) | INTRAMUSCULAR | Status: DC | PRN

## 2018-07-07 MED ORDER — FAMOTIDINE 20 MG PO TABS
10.0000 mg | ORAL_TABLET | Freq: Every day | ORAL | Status: DC
  Administered 2018-07-07 – 2018-07-08 (×2): 10 mg via ORAL
  Filled 2018-07-07 (×2): qty 1

## 2018-07-07 MED ORDER — ACETAMINOPHEN 650 MG RE SUPP: 650.0000 mg | Freq: Four times a day (QID) | RECTAL | Status: DC | PRN

## 2018-07-07 MED ORDER — ESCITALOPRAM OXALATE 20 MG PO TABS
20.0000 mg | ORAL_TABLET | Freq: Every day | ORAL | Status: DC
  Administered 2018-07-07 – 2018-07-09 (×3): 20 mg via ORAL
  Filled 2018-07-07 (×3): qty 1

## 2018-07-07 MED ORDER — ONDANSETRON HCL 4 MG PO TABS: 4.0000 mg | ORAL_TABLET | Freq: Four times a day (QID) | ORAL | Status: DC | PRN

## 2018-07-07 MED ORDER — ENOXAPARIN SODIUM 40 MG/0.4ML ~~LOC~~ SOLN
40.0000 mg | SUBCUTANEOUS | Status: DC
  Administered 2018-07-07: 40 mg via SUBCUTANEOUS
  Filled 2018-07-07: qty 0.4

## 2018-07-07 MED ORDER — ALPRAZOLAM 0.25 MG PO TABS
0.2500 mg | ORAL_TABLET | Freq: Two times a day (BID) | ORAL | Status: DC | PRN
  Administered 2018-07-07: 0.25 mg via ORAL
  Filled 2018-07-07: qty 1

## 2018-07-07 MED ORDER — VITAMIN D3 25 MCG (1000 UNIT) PO TABS
1000.0000 [IU] | ORAL_TABLET | Freq: Every day | ORAL | Status: DC
  Administered 2018-07-07 – 2018-07-09 (×3): 1000 [IU] via ORAL
  Filled 2018-07-07 (×3): qty 1

## 2018-07-07 MED ORDER — ACETAMINOPHEN 325 MG PO TABS: 650.0000 mg | ORAL_TABLET | Freq: Four times a day (QID) | ORAL | Status: DC | PRN

## 2018-07-07 MED ORDER — FENTANYL 25 MCG/HR TD PT72
1.0000 | MEDICATED_PATCH | TRANSDERMAL | Status: DC
  Administered 2018-07-07 – 2018-07-09 (×2): 1 via TRANSDERMAL
  Filled 2018-07-07 (×2): qty 1

## 2018-07-07 MED ORDER — RISPERIDONE 0.5 MG PO TABS
0.5000 mg | ORAL_TABLET | Freq: Every day | ORAL | Status: DC
  Administered 2018-07-07 – 2018-07-08 (×3): 0.5 mg via ORAL
  Filled 2018-07-07 (×3): qty 1

## 2018-07-07 MED ORDER — VITAMIN B-12 1000 MCG PO TABS
1000.0000 ug | ORAL_TABLET | Freq: Every day | ORAL | Status: DC
  Administered 2018-07-07 – 2018-07-09 (×3): 1000 ug via ORAL
  Filled 2018-07-07 (×3): qty 1

## 2018-07-07 MED ORDER — AMLODIPINE BESYLATE 5 MG PO TABS
2.5000 mg | ORAL_TABLET | Freq: Every day | ORAL | Status: DC
  Administered 2018-07-07 – 2018-07-08 (×3): 2.5 mg via ORAL
  Filled 2018-07-07 (×3): qty 1

## 2018-07-07 NOTE — Plan of Care (Signed)
85 year old young female very pleasant admitted with fever diarrhea and hypoxia admitted for rule out COVID-19 and being treated for pneumonia.  And 5 with diarrhea I will give her 40 of potassium and recheck labs tomorrow.

## 2018-07-07 NOTE — H&P (Signed)
History and Physical    Erika Ricke [id number] DOB: September 09, 1924 DOA: 07/06/2018  PCP: Lise AuerKhan, Jaber A, MD  Patient coming from: Patient was transferred [facility].  Chief Complaint: Fever and diarrhea.  HPI: Erika Rice is a 85 y.o. female with history of hypertension COPD chronic pain has been experiencing over the last 2 days shortness of breath diarrhea generalized weakness.  Usually uses 2 L oxygen at night.  Lives at home.  Needs help for ambulation.  Given the symptoms persistent for last 2 days was [facility].  Patient also has generalized body ache which is chronic.  ED Course: In the ER patient had a fever of 101.5 with CBC showing lymphopenia sed rate of 31 CRP of 53 LDH of 473 ferritin of 53 lactate was normal procalcitonin 0.1.  CT scan of the abdomen done showed colitis and also proctitis.  C. difficile was negative.  CT scan also showed right lower lobe infiltrates concerning for pneumonia.  COVID-19 test was ordered.   Review of Systems: As per HPI, rest all negative.   Past Medical History:  Diagnosis Date  . Atrial fibrillation (HCC)   . Bradycardia   . COPD (chronic obstructive pulmonary disease) (HCC)   . Degenerative joint disease   . Dementia (HCC)   . Diabetes mellitus type 2, noninsulin dependent (HCC)   . Dyspnea   . GERD (gastroesophageal reflux disease)   . Hypertension   . Lymphopenia   . Right lower lobe pulmonary infiltrate   . Syncope   . UTI (urinary tract infection)   . Weakness     Past Surgical History:  Procedure Laterality Date  . CHOLECYSTECTOMY    . HYSTERECTOMY ABDOMINAL WITH SALPINGECTOMY    . TONSILLECTOMY       reports that she has never smoked. She has never used smokeless tobacco. She reports that she does not drink alcohol or use drugs.  Allergies  Allergen Reactions  . Ace Inhibitors Other (See Comments)    Does not recall   . Sulfamethoxazole Rash and Other (See Comments)    Does not  recall     Family History  Problem Relation Age of Onset  . Hypertension Mother   . Diabetes Mother   . Hypertension Father   . Cancer Brother     Prior to Admission medications   Medication Sig Start Date End Date Taking? Authorizing Provider  ALPRAZolam (XANAX) 0.25 MG tablet Take 0.25 mg by mouth See admin instructions. Twice daily Before a nap or bedtime if needed for sleep 10/14/16  Yes [provider]  amLODipine (NORVASC) 2.5 MG tablet Take 2.5 mg by mouth daily. 04/11/18  Yes [provider]  celecoxib (CELEBREX) 200 MG capsule Take 200 mg by mouth daily.   Yes [provider]  Cholecalciferol (VITAMIN D3) 1000 units CAPS Take 1 capsule by mouth daily.    Yes [provider]  escitalopram (LEXAPRO) 20 MG tablet Take 20 mg by mouth daily.  10/04/16  Yes [provider]  famotidine (PEPCID) 10 MG tablet Take 10 mg by mouth at bedtime.   Yes [provider]  fentaNYL (DURAGESIC) 25 MCG/HR Place 1 patch onto the skin every other day. 06/16/18  Yes [provider]  gabapentin (NEURONTIN) 800 MG tablet Take 800 mg by mouth 3 (three) times daily. 05/29/18  Yes [provider]  meclizine (ANTIVERT) 12.5 MG tablet Take 12.5 mg by mouth every 6 (six) hours as needed  for dizziness.  06/08/18  Yes [provider]  memantine (NAMENDA) 10 MG tablet Take 10 mg by mouth 2 (two) times daily. 05/30/18  Yes [provider]  oxyCODONE-acetaminophen (PERCOCET) 7.5-325 MG tablet Take 1 tablet by mouth every 8 (eight) hours as needed for moderate pain.  12/24/16  Yes [provider]  pantoprazole (PROTONIX) 40 MG tablet Take 40 mg by mouth every morning.  11/15/16  Yes [provider]  risperiDONE (RISPERDAL) 0.25 MG tablet Take 0.5 mg by mouth at bedtime.  10/04/16  Yes [provider]  vitamin B-12 (CYANOCOBALAMIN) 1000 MCG tablet Take 1,000 mcg by mouth daily.    Yes [provider]     Physical Exam: Vitals:   07/06/18 2345 07/06/18 2359  BP:  (!) 169/131  Pulse:  65  Resp:  20  Temp:  98.2 F (36.8 C)  TempSrc:  Oral  SpO2:  100%  Weight: 81.6 kg   Height: 5\' 3"  (1.6 m)       Constitutional: Moderately built and nourished. Vitals:   07/06/18 2345 07/06/18 2359  BP:  (!) 169/131  Pulse:  65  Resp:  20  Temp:  98.2 F (36.8 C)  TempSrc:  Oral  SpO2:  100%  Weight: 81.6 kg   Height: 5\' 3"  (1.6 m)    Eyes: Anicteric no pallor. ENMT: No discharge from the ears eyes nose or mouth. Neck: No mass felt.  No neck rigidity. Respiratory: No rhonchi or crepitations. Cardiovascular: S1-S2 heard. Abdomen: Soft nontender bowel sounds present. Musculoskeletal: No edema. Skin: No rash. Neurologic: Alert awake oriented to name follows commands moves all extremities. Psychiatric: Follows commands.   Labs on Admission: I have personally reviewed following labs and imaging studies  CBC: Recent Labs  Lab 07/07/18 0033  WBC 7.6  NEUTROABS 5.6  HGB 12.9  HCT 41.4  MCV 101.5*  PLT 190   Basic Metabolic Panel: Recent Labs  Lab 07/07/18 0033  NA 138  K 3.5  CL 105  CO2 24  GLUCOSE 94  BUN 12  CREATININE 0.73  CALCIUM 9.1   GFR: Estimated Creatinine Clearance: 46.4 mL/min (by C-G formula based on SCr of 0.73 mg/dL). Liver Function Tests: Recent Labs  Lab 07/07/18 0033  AST 18  ALT 9  ALKPHOS 53  BILITOT 1.0  PROT 6.3*  ALBUMIN 3.4*   No results for input(s): LIPASE, AMYLASE in the last 168 hours. No results for input(s): AMMONIA in the last 168 hours. Coagulation Profile: No results for input(s): INR, PROTIME in the last 168 hours. Cardiac Enzymes: Recent Labs  Lab 07/07/18 0033  TROPONINI <0.03   BNP (last 3 results) No results for input(s): PROBNP in the last 8760 hours. HbA1C: No results for input(s): HGBA1C in the last 72 hours. CBG: No results for input(s): GLUCAP in the last 168 hours. Lipid Profile: No results for  input(s): CHOL, HDL, LDLCALC, TRIG, CHOLHDL, LDLDIRECT in the last 72 hours. Thyroid Function Tests: No results for input(s): TSH, T4TOTAL, FREET4, T3FREE, THYROIDAB in the last 72 hours. Anemia Panel: No results for input(s): VITAMINB12, FOLATE, FERRITIN, TIBC, IRON, RETICCTPCT in the last 72 hours. Urine analysis: No results found for: COLORURINE, APPEARANCEUR, LABSPEC, PHURINE, GLUCOSEU, HGBUR, BILIRUBINUR, KETONESUR, PROTEINUR, UROBILINOGEN, NITRITE, LEUKOCYTESUR Sepsis Labs: @LABRCNTIP (procalcitonin:4,lacticidven:4) )No results found for this or any previous visit (from the past 240 hour(s)).   Radiological Exams on Admission: No results found.   Assessment/Plan Principal Problem:   Acute respiratory failure with hypoxia (HCC) Active Problems:  Suspected Covid-19 Virus Infection   Asthma   Dementia (HCC)   Essential hypertension   Depression   GERD (gastroesophageal reflux disease)    1. Acute respiratory failure with hypoxia concerning for pneumonia for which patient is on empiric antibiotics and COVID-[facility] test [facility].  Which has to be followed. 2. Diarrhea with CAT scan showing colitis and proctitis on empiric antibiotics for now.  Per report C. diff[facility].  Any further diarrhea may need further stool studies.  Diarrhea could also be from COVID-19 if positive.  Check lactate levels. 3. Hypertension uncontrolled in addition to home dose of amlodipine will keep patient on PRN IV hydralazine. 4. Chronic pain on fentanyl patch and gabapentin.  Takes oxycodone as needed. 5. Dementia on Namenda. 6. Depression anxiety on Lexapro and Xanax. 7. GERD on PPI and Protonix.   DVT prophylaxis: Lovenox. Code Status: Full code. Family Communication: No family at the bedside. Disposition Plan: Home. Consults called: None. Admission status: Inpatient.   Eduard ClosArshad N Jerrad Mendibles MD [facility] Pager [phone].  If 7PM-7AM, please  contact night-coverage [email or web address] Password [email or web address]  07/07/2018, 1:34 AM

## 2018-07-08 LAB — BASIC METABOLIC PANEL
Anion gap: 9 (ref 5–15)
BUN: 11 mg/dL (ref 8–23)
CO2: 27 mmol/L (ref 22–32)
Calcium: 9.4 mg/dL (ref 8.9–10.3)
Chloride: 104 mmol/L (ref 98–111)
Creatinine, Ser: 0.64 mg/dL (ref 0.44–1.00)
GFR calc Af Amer: >60 mL/min (ref >60)
GFR calc non Af Amer: >60 mL/min (ref >60)
Glucose, Bld: 76 mg/dL (ref 70–99)
Potassium: 4 mmol/L (ref 3.5–5.1)
Sodium: 140 mmol/L (ref 135–145)

## 2018-07-08 LAB — MAGNESIUM: Magnesium: 1.8 mg/dL (ref 1.7–2.4)

## 2018-07-08 MED ORDER — AMLODIPINE BESYLATE 10 MG PO TABS
10.0000 mg | ORAL_TABLET | Freq: Every day | ORAL | Status: DC
  Administered 2018-07-09: 10 mg via ORAL
  Filled 2018-07-08: qty 1

## 2018-07-08 MED ORDER — ENOXAPARIN SODIUM 40 MG/0.4ML ~~LOC~~ SOLN
40.0000 mg | SUBCUTANEOUS | Status: DC
  Administered 2018-07-08 – 2018-07-09 (×2): 40 mg via SUBCUTANEOUS
  Filled 2018-07-08 (×2): qty 0.4

## 2018-07-08 MED ORDER — CIPROFLOXACIN HCL 250 MG PO TABS
250.0000 mg | ORAL_TABLET | Freq: Two times a day (BID) | ORAL | Status: DC
  Administered 2018-07-08 – 2018-07-09 (×3): 250 mg via ORAL
  Filled 2018-07-08 (×4): qty 1

## 2018-07-08 MED ORDER — POLYVINYL ALCOHOL 1.4 % OP SOLN
1.0000 [drp] | OPHTHALMIC | Status: DC | PRN
  Administered 2018-07-08 – 2018-07-09 (×2): 1 [drp] via OPHTHALMIC
  Filled 2018-07-08: qty 15

## 2018-07-08 MED ORDER — METRONIDAZOLE 500 MG PO TABS
500.0000 mg | ORAL_TABLET | Freq: Three times a day (TID) | ORAL | Status: DC
  Administered 2018-07-08 – 2018-07-09 (×4): 500 mg via ORAL
  Filled 2018-07-08 (×5): qty 1

## 2018-07-08 MED ORDER — MAGNESIUM SULFATE 2 GM/50ML IV SOLN
2.0000 g | Freq: Once | INTRAVENOUS | Status: AC
  Administered 2018-07-08: 2 g via INTRAVENOUS
  Filled 2018-07-08: qty 50

## 2018-07-08 NOTE — Plan of Care (Addendum)
Pt off O2 for this shift, O2 sats 95% on room air. Pt up to [facility] multiple times and chair 2x for meals. Gave Percocet for generalized pain at 1405, with improvement.

## 2018-07-08 NOTE — Progress Notes (Signed)
   07/08/18 0405  Vitals  Temp 98.3 F (36.8 C)  Temp Source Oral  BP (!) 184/73  MAP (mmHg) 104  BP Location Right Arm  BP Method Automatic  Patient Position (if appropriate) Lying  Pulse Rate 62   MD on call notified of patient's current BP of 184/73. Order to notify physician if SBP> 160. Isabelle Course, RN

## 2018-07-08 NOTE — Progress Notes (Signed)
PROGRESS NOTE    Erika Rice  RXY:[id number] DOB: [birth year] DOA: 07/06/2018 PCP: Lise Auer, MD  Brief Narrative: 85 y.o. female with history of hypertension COPD chronic pain has been experiencing over the last 2 days shortness of breath diarrhea generalized weakness.  Usually uses 2 L oxygen at night.  Lives at home.  Needs help for ambulation.  Given the symptoms persistent for last 2 days was brought to the ER at [place].  Patient also has generalized body ache which is chronic.  ED Course: In the ER patient had a fever of 101.5 with CBC showing lymphopenia sed rate of 31 CRP of 53 LDH of 473 ferritin of 53 lactate was normal procalcitonin 0.1.  CT scan of the abdomen done showed colitis and also proctitis.  C. difficile was negative.  CT scan also showed right lower lobe infiltrates concerning for pneumonia.  COVID-19 test was ordered. Assessment & Plan:   Principal Problem:   Acute respiratory failure with hypoxia (HCC) Active Problems:   Suspected Covid-19 Virus Infection   Asthma   Dementia (HCC)   Essential hypertension   Depression   GERD (gastroesophageal reflux disease)   #1 colitis with proctitis by CT scan done at [place].  Patient presented with diarrhea and fever.  She has not had any diarrhea or fever since admission.  I have added Cipro and Flagyl.  Patient was admitted with fever and generalized weakness found to be hypokalemic due to diarrhea.  #2?  Hypoxia patient was admitted with possible hypoxic respiratory failure but patient remains on room air since admission saturation above 95%.  CT scan again showed concerns for pneumonia but she really has not had any symptoms she appears to be comfortable talking in full sentences denies any cough or shortness of breath.  #3 hypertension blood pressure remains high increase Norvasc to 10 mg daily.  #4 chronic pain continue fentanyl patch and gabapentin and oxycodone as needed.  #5 dementia continue Namenda,  Risperdal  #6 depression continue Lexapro and Xanax  #7 GERD continue Protonix.   DVT prophylaxis: Lovenox. Code Status: Full code. Family Communication: Discussed with her son Maurine Minister [phone].  Patient lives at home with her son her son is also disabled and they both take care of each other.  Son wanted to bring in her glasses and dentures to the security. Disposition Plan: Patient will need physical therapy consult once covered ruled out to assess need for rehab versus home with home health. Consults called: None.       Estimated body mass index is 31.89 kg/m as calculated from the following:   Height as of this encounter: 5\' 3"  (1.6 m).   Weight as of this encounter: 81.6 kg.   Subjective:  Is resting in bed denies any complaints appears comfortable denies any nausea vomiting diarrhea denies any abdominal pain Objective: Vitals:   07/07/18 1332 07/07/18 2116 07/08/18 0405 07/08/18 0726  BP: (!) 154/50 (!) 156/76 (!) 184/73 (!) 158/88  Pulse: (!) 58 (!) 57 62   Resp: 18 18 15    Temp: 98.1 F (36.7 C) 98.3 F (36.8 C) 98.3 F (36.8 C)   TempSrc: Oral Oral Oral   SpO2: 100% 100% 100%   Weight:      Height:        Intake/Output Summary (Last 24 hours) at 07/08/2018 0901 Last data filed at 07/08/2018 0600 Gross per 24 hour  Intake 240 ml  Output 2025 ml  Net -1785 ml   [employer]  07/06/18 2345  Weight: 81.6 kg    Examination:  General exam: Appears calm and comfortable  Respiratory system: Clear to auscultation. Respiratory effort normal. Cardiovascular system: S1 & S2 heard, RRR. No JVD, murmurs, rubs, gallops or clicks. No pedal edema. Gastrointestinal system: Abdomen is nondistended, soft and nontender. No organomegaly or masses felt. Normal bowel sounds heard. Central nervous system: Alert and oriented. No focal neurological deficits. Extremities: Symmetric 5 x 5 power. Skin: No rashes, lesions or ulcers Psychiatry: Judgement and insight appear  normal. Mood & affect appropriate.     Data Reviewed: I have personally reviewed following labs and imaging studies  CBC: Recent Labs  Lab 07/07/18 0033  WBC 7.6  NEUTROABS 5.6  HGB 12.9  HCT 41.4  MCV 101.5*  PLT 190   Basic Metabolic Panel: Recent Labs  Lab 07/07/18 0033 07/08/18 0404  NA 138 140  K 3.5 4.0  CL 105 104  CO2 24 27  GLUCOSE 94 76  BUN 12 11  CREATININE 0.73 0.64  CALCIUM 9.1 9.4  MG  --  1.8   GFR: Estimated Creatinine Clearance: 46.4 mL/min (by C-G formula based on SCr of 0.64 mg/dL). Liver Function Tests: Recent Labs  Lab 07/07/18 0033  AST 18  ALT 9  ALKPHOS 53  BILITOT 1.0  PROT 6.3*  ALBUMIN 3.4*   No results for input(s): LIPASE, AMYLASE in the last 168 hours. No results for input(s): AMMONIA in the last 168 hours. Coagulation Profile: No results for input(s): INR, PROTIME in the last 168 hours. Cardiac Enzymes: Recent Labs  Lab 07/07/18 0033  TROPONINI <0.03   BNP (last 3 results) No results for input(s): PROBNP in the last 8760 hours. HbA1C: No results for input(s): HGBA1C in the last 72 hours. CBG: No results for input(s): GLUCAP in the last 168 hours. Lipid Profile: No results for input(s): CHOL, HDL, LDLCALC, TRIG, CHOLHDL, LDLDIRECT in the last 72 hours. Thyroid Function Tests: No results for input(s): TSH, T4TOTAL, FREET4, T3FREE, THYROIDAB in the last 72 hours. Anemia Panel: No results for input(s): VITAMINB12, FOLATE, FERRITIN, TIBC, IRON, RETICCTPCT in the last 72 hours. Sepsis Labs: Recent Labs  Lab 07/07/18 0033 07/07/18 0412  LATICACIDVEN 1.4 0.7    No results found for this or any previous visit (from the past 240 hour(s)).       Radiology Studies: No results found.      Scheduled Meds: . amLODipine  2.5 mg Oral Daily  . cholecalciferol  1,000 Units Oral Daily  . escitalopram  20 mg Oral Daily  . famotidine  10 mg Oral QHS  . fentaNYL  1 patch Transdermal Q48H  . gabapentin  800 mg Oral  TID  . memantine  10 mg Oral BID  . pantoprazole  40 mg Oral q morning - 10a  . risperiDONE  0.5 mg Oral QHS  . vitamin B-12  1,000 mcg Oral Daily   Continuous Infusions:   LOS: 2 days      Alwyn Ren, MD [facility]  If 7PM-7AM, please contact night-coverage [email or web address] Password [email or web address] 07/08/2018, 9:01 AM

## 2018-07-09 LAB — CBC
HCT: 36.9 % (ref 36.0–46.0)
Hemoglobin: 11.5 g/dL — ABNORMAL LOW (ref 12.0–15.0)
MCH: 31.3 pg (ref 26.0–34.0)
MCHC: 31.2 g/dL (ref 30.0–36.0)
MCV: 100.3 fL — ABNORMAL HIGH (ref 80.0–100.0)
Platelets: 248 10*3/uL (ref 150–400)
RBC: 3.68 MIL/uL — ABNORMAL LOW (ref 3.87–5.11)
RDW: 12.1 % (ref 11.5–15.5)
WBC: 4.4 10*3/uL (ref 4.0–10.5)
nRBC: 0 % (ref 0.0–0.2)

## 2018-07-09 LAB — COMPREHENSIVE METABOLIC PANEL
ALT: 11 U/L (ref 0–44)
AST: 21 U/L (ref 15–41)
Albumin: 3.1 g/dL — ABNORMAL LOW (ref 3.5–5.0)
Alkaline Phosphatase: 46 U/L (ref 38–126)
Anion gap: 9 (ref 5–15)
BUN: 14 mg/dL (ref 8–23)
CO2: 32 mmol/L (ref 22–32)
Calcium: 9.5 mg/dL (ref 8.9–10.3)
Chloride: 101 mmol/L (ref 98–111)
Creatinine, Ser: 0.86 mg/dL (ref 0.44–1.00)
GFR calc Af Amer: >60 mL/min (ref >60)
GFR calc non Af Amer: 59 mL/min — ABNORMAL LOW (ref >60)
Glucose, Bld: 118 mg/dL — ABNORMAL HIGH (ref 70–99)
Potassium: 3.6 mmol/L (ref 3.5–5.1)
Sodium: 142 mmol/L (ref 135–145)
Total Bilirubin: 0.4 mg/dL (ref 0.3–1.2)
Total Protein: 5.6 g/dL — ABNORMAL LOW (ref 6.5–8.1)

## 2018-07-09 LAB — D-DIMER, QUANTITATIVE: D-Dimer, Quant: 1.49 ug/mL-FEU — ABNORMAL HIGH (ref 0.00–0.50)

## 2018-07-09 LAB — C-REACTIVE PROTEIN: CRP: 3.4 mg/dL — ABNORMAL HIGH (ref <1.0)

## 2018-07-09 MED ORDER — SACCHAROMYCES BOULARDII 250 MG PO CAPS: 250.0000 mg | ORAL_CAPSULE | Freq: Two times a day (BID) | ORAL | 0 refills | Status: DC

## 2018-07-09 MED ORDER — METRONIDAZOLE 500 MG PO TABS: 500.0000 mg | ORAL_TABLET | Freq: Three times a day (TID) | ORAL | 0 refills | Status: DC

## 2018-07-09 MED ORDER — POLYVINYL ALCOHOL 1.4 % OP SOLN: 1.0000 [drp] | OPHTHALMIC | 0 refills | Status: AC | PRN

## 2018-07-09 MED ORDER — AMLODIPINE BESYLATE 10 MG PO TABS: 10.0000 mg | ORAL_TABLET | Freq: Every day | ORAL | 0 refills | Status: DC

## 2018-07-09 MED ORDER — MUSCLE RUB 10-15 % EX CREA
TOPICAL_CREAM | CUTANEOUS | Status: DC | PRN
  Filled 2018-07-09: qty 85

## 2018-07-09 MED ORDER — CIPROFLOXACIN HCL 250 MG PO TABS: 250.0000 mg | ORAL_TABLET | Freq: Two times a day (BID) | ORAL | 0 refills | Status: DC

## 2018-07-09 NOTE — Plan of Care (Signed)
Discharge information given and discussed with pt and son. Covid quarantine agreement signed and placed in pt chart. Pt discharged with dentures and glasses, wheeled out by this nurse and Meagan, NT. Exited through education entrance with [facility] and security present.

## 2018-07-09 NOTE — Discharge Summary (Signed)
Physician Discharge Summary  Erika Rice VJK:[id number] DOB: 1924-08-15 DOA: 07/06/2018  PCP: Lise Auer, MD  Admit date: 07/06/2018 Discharge date: 07/09/2018  Admitted From:home Disposition: home Recommendations for Outpatient Follow-up:  1. Follow up with PCP in 1-2 weeks please refer her to a GI for patient has colitis and proctitis. 2. Please obtain BMP/CBC in one week 3. Please follow up on the following COVID-19 results  Home Health: None Equipment/Devices: None Discharge Condition: Stable CODE STATUS: Full code Diet recommendation: Cardiac Brief/Interim Summary: 85 y.o.femalewithhistory of hypertension COPD chronic pain has been experiencing over the last 2 days shortness of breath diarrhea generalized weakness. Usually uses 2 L oxygen at night. Lives at home. Needs help for ambulation. Given the symptoms persistent for last 2 days was brought to the ER at [facility].Patient also has generalized body ache which is chronic.  ED Course:In the ER patient had a fever of 101.5 with CBC showing lymphopenia sed rate of 31 CRP of 53 LDH of 473 ferritin of 53 lactate was normal procalcitonin 0.1. CT scan of the abdomen done showed colitis and also proctitis. C. difficile was negative. CT scan also showed right lower lobe infiltrates concerning for pneumonia. COVID-19 test was ordered. Discharge Diagnoses:  Principal Problem:   Acute respiratory failure with hypoxia (HCC) Active Problems:   Suspected Covid-19 Virus Infection   Asthma   Dementia (HCC)   Essential hypertension   Depression   GERD (gastroesophageal reflux disease)  #1 colitis with proctitis by CT scan done at [facility].  Patient presented with diarrhea and fever.  She has not had any diarrhea or fever since admission. Patient was admitted with fever and generalized weakness found to be hypokalemic due to diarrhea.  She will be discharged home on Cipro and Flagyl today.  Patient has no complaints are all no  diarrhea no fever.  She has been out of bed sitting up in chair with minimal support.  She lives at home with her son.  #2?  Hypoxia patient was admitted with possible hypoxic respiratory failure but patient remains on room air since admission saturation above 95%.  CT scan again showed concerns for pneumonia but she really has not had any symptoms she appears to be comfortable talking in full sentences denies any cough or shortness of breath.  #3 hypertension blood pressure remains high.  I will increase her Norvasc to 5 mg daily.  Discussed with the son if it still remains high her Norvasc dose needs to go up.  However he did say that when she was on 10 mg her leg started swelling up  #4 chronic pain continue fentanyl patch and gabapentin and oxycodone as needed.  #5 dementia continue Namenda, Risperdal  #6 depression continue Lexapro and Xanax  #7 GERD continue Protonix.  Estimated body mass index is 31.89 kg/m as calculated from the following:   Height as of this encounter: 5\' 3"  (1.6 m).   Weight as of this encounter: 81.6 kg.  Discharge Instructions   Allergies as of 07/09/2018      Reactions   Ace Inhibitors Other (See Comments)   Does not recall    Sulfamethoxazole Rash, Other (See Comments)   Does not recall       Medication List    TAKE these medications   ALPRAZolam 0.25 MG tablet Commonly known as:  XANAX Take 0.25 mg by mouth See admin instructions. Twice daily Before a nap or bedtime if needed for sleep   amLODipine 10 MG tablet Commonly  known as:  NORVASC Take 1 tablet (10 mg total) by mouth daily. What changed:    medication strength  how much to take   celecoxib 200 MG capsule Commonly known as:  CELEBREX Take 200 mg by mouth daily.   ciprofloxacin 250 MG tablet Commonly known as:  CIPRO Take 1 tablet (250 mg total) by mouth 2 (two) times daily.   escitalopram 20 MG tablet Commonly known as:  LEXAPRO Take 20 mg by mouth daily.    famotidine 10 MG tablet Commonly known as:  PEPCID Take 10 mg by mouth at bedtime.   fentaNYL 25 MCG/HR Commonly known as:  DURAGESIC Place 1 patch onto the skin every other day.   gabapentin 800 MG tablet Commonly known as:  NEURONTIN Take 800 mg by mouth 3 (three) times daily.   meclizine 12.5 MG tablet Commonly known as:  ANTIVERT Take 12.5 mg by mouth every 6 (six) hours as needed for dizziness.   memantine 10 MG tablet Commonly known as:  NAMENDA Take 10 mg by mouth 2 (two) times daily.   metroNIDAZOLE 500 MG tablet Commonly known as:  FLAGYL Take 1 tablet (500 mg total) by mouth every 8 (eight) hours.   oxyCODONE-acetaminophen 7.5-325 MG tablet Commonly known as:  PERCOCET Take 1 tablet by mouth every 8 (eight) hours as needed for moderate pain.   pantoprazole 40 MG tablet Commonly known as:  PROTONIX Take 40 mg by mouth every morning.   polyvinyl alcohol 1.4 % ophthalmic solution Commonly known as:  LIQUIFILM TEARS Place 1 drop into both eyes as needed for dry eyes.   risperiDONE 0.25 MG tablet Commonly known as:  RISPERDAL Take 0.5 mg by mouth at bedtime.   saccharomyces boulardii 250 MG capsule Commonly known as:  Florastor Take 1 capsule (250 mg total) by mouth 2 (two) times daily.   vitamin B-12 1000 MCG tablet Commonly known as:  CYANOCOBALAMIN Take 1,000 mcg by mouth daily.   Vitamin D3 25 MCG (1000 UT) Caps Take 1 capsule by mouth daily.      Follow-up Information    Lise AuerKhan, Jaber A, MD Follow up.   Specialty:  Family Medicine Contact information: [address] [place] [id number] [phone]          Allergies  Allergen Reactions  . Ace Inhibitors Other (See Comments)    Does not recall   . Sulfamethoxazole Rash and Other (See Comments)    Does not recall     Consultations: None   Procedures/Studies:  No results found. (Echo, Carotid, EGD, Colonoscopy, ERCP)    Subjective: Feels well anxious to go home no  complaints no chest pain shortness of breath nausea vomiting diarrhea eating well.  Discharge Exam: Vitals:   07/08/18 2046 07/09/18 0400  BP: (!) 138/56 (!) 152/78  Pulse: 67 72  Resp: 15 18  Temp: 98.5 F (36.9 C) 98.1 F (36.7 C)  SpO2: 93% 97%   Vitals:   07/08/18 0726 07/08/18 1359 07/08/18 2046 07/09/18 0400  BP: (!) 158/88 (!) 144/64 (!) 138/56 (!) 152/78  Pulse:  70 67 72  Resp:  14 15 18   Temp:  97.8 F (36.6 C) 98.5 F (36.9 C) 98.1 F (36.7 C)  TempSrc:  Oral Oral Oral  SpO2:  95% 93% 97%  Weight:      Height:        General: Pt is alert, awake, not in acute distress Cardiovascular: RRR, S1/S2 +, no rubs, no gallops Respiratory: CTA bilaterally,  no wheezing, no rhonchi Abdominal: Soft, NT, ND, bowel sounds + Extremities: no edema, no cyanosis    The results of significant diagnostics from this hospitalization (including imaging, microbiology, ancillary and laboratory) are listed below for reference.     Microbiology: No results found for this or any previous visit (from the past 240 hour(s)).   Labs: BNP (last 3 results) Recent Labs    07/07/18 0033  BNP 281.5*   Basic Metabolic Panel: Recent Labs  Lab 07/07/18 0033 07/08/18 0404 07/09/18 0508  NA 138 140 142  K 3.5 4.0 3.6  CL 105 104 101  CO2 24 27 32  GLUCOSE 94 76 118*  BUN 12 11 14   CREATININE 0.73 0.64 0.86  CALCIUM 9.1 9.4 9.5  MG  --  1.8  --    Liver Function Tests: Recent Labs  Lab 07/07/18 0033 07/09/18 0508  AST 18 21  ALT 9 11  ALKPHOS 53 46  BILITOT 1.0 0.4  PROT 6.3* 5.6*  ALBUMIN 3.4* 3.1*   No results for input(s): LIPASE, AMYLASE in the last 168 hours. No results for input(s): AMMONIA in the last 168 hours. CBC: Recent Labs  Lab 07/07/18 0033 07/09/18 0508  WBC 7.6 4.4  NEUTROABS 5.6  --   HGB 12.9 11.5*  HCT 41.4 36.9  MCV 101.5* 100.3*  PLT 190 248   Cardiac Enzymes: Recent Labs  Lab 07/07/18 0033  TROPONINI <0.03   BNP: Invalid input(s):  POCBNP CBG: No results for input(s): GLUCAP in the last 168 hours. D-Dimer Recent Labs    07/09/18 0508  DDIMER 1.49*   Hgb A1c No results for input(s): HGBA1C in the last 72 hours. Lipid Profile No results for input(s): CHOL, HDL, LDLCALC, TRIG, CHOLHDL, LDLDIRECT in the last 72 hours. Thyroid function studies No results for input(s): TSH, T4TOTAL, T3FREE, THYROIDAB in the last 72 hours.  Invalid input(s): FREET3 Anemia work up No results for input(s): VITAMINB12, FOLATE, FERRITIN, TIBC, IRON, RETICCTPCT in the last 72 hours. Urinalysis No results found for: COLORURINE, APPEARANCEUR, LABSPEC, PHURINE, GLUCOSEU, HGBUR, BILIRUBINUR, KETONESUR, PROTEINUR, UROBILINOGEN, NITRITE, LEUKOCYTESUR Sepsis Labs Invalid input(s): PROCALCITONIN,  WBC,  LACTICIDVEN Microbiology No results found for this or any previous visit (from the past 240 hour(s)).   Time coordinating discharge: 34 minutes  SIGNED:   Alwyn RenElizabeth G Jozette Castrellon, MD  [facility] 07/09/2018, 9:16 AM  If 7PM-7AM, please contact night-coverage [email or web address] Password [email or web address]

## 2018-07-13 DIAGNOSIS — T402X5A Adverse effect of other opioids, initial encounter: Secondary | ICD-10-CM | POA: Diagnosis not present

## 2018-07-13 DIAGNOSIS — K5903 Drug induced constipation: Secondary | ICD-10-CM | POA: Diagnosis not present

## 2018-07-13 DIAGNOSIS — I10 Essential (primary) hypertension: Secondary | ICD-10-CM | POA: Diagnosis not present

## 2018-07-13 DIAGNOSIS — Z8719 Personal history of other diseases of the digestive system: Secondary | ICD-10-CM | POA: Diagnosis not present

## 2018-07-17 ENCOUNTER — Other Ambulatory Visit: Payer: Self-pay

## 2018-07-20 DIAGNOSIS — H401122 Primary open-angle glaucoma, left eye, moderate stage: Secondary | ICD-10-CM | POA: Diagnosis not present

## 2018-07-20 DIAGNOSIS — H25811 Combined forms of age-related cataract, right eye: Secondary | ICD-10-CM | POA: Diagnosis not present

## 2018-07-24 DIAGNOSIS — R0902 Hypoxemia: Secondary | ICD-10-CM | POA: Diagnosis not present

## 2018-07-25 DIAGNOSIS — J209 Acute bronchitis, unspecified: Secondary | ICD-10-CM | POA: Diagnosis not present

## 2018-07-25 DIAGNOSIS — I10 Essential (primary) hypertension: Secondary | ICD-10-CM | POA: Diagnosis not present

## 2018-08-23 DIAGNOSIS — R0902 Hypoxemia: Secondary | ICD-10-CM | POA: Diagnosis not present

## 2018-08-26 DIAGNOSIS — I10 Essential (primary) hypertension: Secondary | ICD-10-CM | POA: Diagnosis not present

## 2018-08-27 DIAGNOSIS — M069 Rheumatoid arthritis, unspecified: Secondary | ICD-10-CM | POA: Diagnosis not present

## 2018-08-27 DIAGNOSIS — M5489 Other dorsalgia: Secondary | ICD-10-CM | POA: Diagnosis not present

## 2018-08-27 DIAGNOSIS — N189 Chronic kidney disease, unspecified: Secondary | ICD-10-CM | POA: Diagnosis not present

## 2018-08-27 DIAGNOSIS — J189 Pneumonia, unspecified organism: Secondary | ICD-10-CM | POA: Diagnosis not present

## 2018-08-27 DIAGNOSIS — W19XXXA Unspecified fall, initial encounter: Secondary | ICD-10-CM | POA: Diagnosis not present

## 2018-08-27 DIAGNOSIS — E1122 Type 2 diabetes mellitus with diabetic chronic kidney disease: Secondary | ICD-10-CM | POA: Diagnosis not present

## 2018-08-27 DIAGNOSIS — M1990 Unspecified osteoarthritis, unspecified site: Secondary | ICD-10-CM | POA: Diagnosis not present

## 2018-08-27 DIAGNOSIS — F329 Major depressive disorder, single episode, unspecified: Secondary | ICD-10-CM | POA: Diagnosis not present

## 2018-08-27 DIAGNOSIS — Z882 Allergy status to sulfonamides status: Secondary | ICD-10-CM | POA: Diagnosis not present

## 2018-08-27 DIAGNOSIS — J0130 Acute sphenoidal sinusitis, unspecified: Secondary | ICD-10-CM | POA: Diagnosis not present

## 2018-08-27 DIAGNOSIS — J9601 Acute respiratory failure with hypoxia: Secondary | ICD-10-CM | POA: Diagnosis not present

## 2018-08-27 DIAGNOSIS — I2510 Atherosclerotic heart disease of native coronary artery without angina pectoris: Secondary | ICD-10-CM | POA: Diagnosis not present

## 2018-08-27 DIAGNOSIS — R001 Bradycardia, unspecified: Secondary | ICD-10-CM | POA: Diagnosis not present

## 2018-08-27 DIAGNOSIS — R0902 Hypoxemia: Secondary | ICD-10-CM | POA: Diagnosis not present

## 2018-08-27 DIAGNOSIS — I129 Hypertensive chronic kidney disease with stage 1 through stage 4 chronic kidney disease, or unspecified chronic kidney disease: Secondary | ICD-10-CM | POA: Diagnosis not present

## 2018-08-27 DIAGNOSIS — J984 Other disorders of lung: Secondary | ICD-10-CM | POA: Diagnosis not present

## 2018-08-27 DIAGNOSIS — E785 Hyperlipidemia, unspecified: Secondary | ICD-10-CM | POA: Diagnosis not present

## 2018-08-27 DIAGNOSIS — D509 Iron deficiency anemia, unspecified: Secondary | ICD-10-CM | POA: Diagnosis not present

## 2018-08-27 DIAGNOSIS — A419 Sepsis, unspecified organism: Secondary | ICD-10-CM | POA: Diagnosis not present

## 2018-08-27 DIAGNOSIS — G8929 Other chronic pain: Secondary | ICD-10-CM | POA: Diagnosis not present

## 2018-08-27 DIAGNOSIS — K219 Gastro-esophageal reflux disease without esophagitis: Secondary | ICD-10-CM | POA: Diagnosis not present

## 2018-08-27 DIAGNOSIS — J690 Pneumonitis due to inhalation of food and vomit: Secondary | ICD-10-CM | POA: Diagnosis not present

## 2018-08-27 DIAGNOSIS — Z79899 Other long term (current) drug therapy: Secondary | ICD-10-CM | POA: Diagnosis not present

## 2018-08-27 DIAGNOSIS — K449 Diaphragmatic hernia without obstruction or gangrene: Secondary | ICD-10-CM | POA: Diagnosis not present

## 2018-08-27 DIAGNOSIS — J449 Chronic obstructive pulmonary disease, unspecified: Secondary | ICD-10-CM | POA: Diagnosis not present

## 2018-08-27 DIAGNOSIS — B9689 Other specified bacterial agents as the cause of diseases classified elsewhere: Secondary | ICD-10-CM | POA: Diagnosis not present

## 2018-08-27 DIAGNOSIS — R531 Weakness: Secondary | ICD-10-CM | POA: Diagnosis not present

## 2018-08-27 DIAGNOSIS — I4891 Unspecified atrial fibrillation: Secondary | ICD-10-CM | POA: Diagnosis not present

## 2018-08-31 DIAGNOSIS — J189 Pneumonia, unspecified organism: Secondary | ICD-10-CM | POA: Diagnosis not present

## 2018-08-31 DIAGNOSIS — K449 Diaphragmatic hernia without obstruction or gangrene: Secondary | ICD-10-CM | POA: Diagnosis not present

## 2018-08-31 DIAGNOSIS — E7800 Pure hypercholesterolemia, unspecified: Secondary | ICD-10-CM | POA: Diagnosis not present

## 2018-08-31 DIAGNOSIS — R001 Bradycardia, unspecified: Secondary | ICD-10-CM | POA: Diagnosis not present

## 2018-08-31 DIAGNOSIS — M159 Polyosteoarthritis, unspecified: Secondary | ICD-10-CM | POA: Diagnosis not present

## 2018-08-31 DIAGNOSIS — I2510 Atherosclerotic heart disease of native coronary artery without angina pectoris: Secondary | ICD-10-CM | POA: Diagnosis not present

## 2018-08-31 DIAGNOSIS — Z9981 Dependence on supplemental oxygen: Secondary | ICD-10-CM | POA: Diagnosis not present

## 2018-08-31 DIAGNOSIS — D509 Iron deficiency anemia, unspecified: Secondary | ICD-10-CM | POA: Diagnosis not present

## 2018-08-31 DIAGNOSIS — N189 Chronic kidney disease, unspecified: Secondary | ICD-10-CM | POA: Diagnosis not present

## 2018-08-31 DIAGNOSIS — K5790 Diverticulosis of intestine, part unspecified, without perforation or abscess without bleeding: Secondary | ICD-10-CM | POA: Diagnosis not present

## 2018-08-31 DIAGNOSIS — K219 Gastro-esophageal reflux disease without esophagitis: Secondary | ICD-10-CM | POA: Diagnosis not present

## 2018-08-31 DIAGNOSIS — Z79899 Other long term (current) drug therapy: Secondary | ICD-10-CM | POA: Diagnosis not present

## 2018-08-31 DIAGNOSIS — I129 Hypertensive chronic kidney disease with stage 1 through stage 4 chronic kidney disease, or unspecified chronic kidney disease: Secondary | ICD-10-CM | POA: Diagnosis not present

## 2018-08-31 DIAGNOSIS — M069 Rheumatoid arthritis, unspecified: Secondary | ICD-10-CM | POA: Diagnosis not present

## 2018-08-31 DIAGNOSIS — Z9049 Acquired absence of other specified parts of digestive tract: Secondary | ICD-10-CM | POA: Diagnosis not present

## 2018-08-31 DIAGNOSIS — I4891 Unspecified atrial fibrillation: Secondary | ICD-10-CM | POA: Diagnosis not present

## 2018-08-31 DIAGNOSIS — E1122 Type 2 diabetes mellitus with diabetic chronic kidney disease: Secondary | ICD-10-CM | POA: Diagnosis not present

## 2018-08-31 DIAGNOSIS — Z79891 Long term (current) use of opiate analgesic: Secondary | ICD-10-CM | POA: Diagnosis not present

## 2018-09-05 DIAGNOSIS — R21 Rash and other nonspecific skin eruption: Secondary | ICD-10-CM | POA: Diagnosis not present

## 2018-09-05 DIAGNOSIS — K219 Gastro-esophageal reflux disease without esophagitis: Secondary | ICD-10-CM | POA: Diagnosis not present

## 2018-09-05 DIAGNOSIS — Z9981 Dependence on supplemental oxygen: Secondary | ICD-10-CM | POA: Diagnosis not present

## 2018-09-05 DIAGNOSIS — M159 Polyosteoarthritis, unspecified: Secondary | ICD-10-CM | POA: Diagnosis not present

## 2018-09-05 DIAGNOSIS — Z9049 Acquired absence of other specified parts of digestive tract: Secondary | ICD-10-CM | POA: Diagnosis not present

## 2018-09-05 DIAGNOSIS — I129 Hypertensive chronic kidney disease with stage 1 through stage 4 chronic kidney disease, or unspecified chronic kidney disease: Secondary | ICD-10-CM | POA: Diagnosis not present

## 2018-09-05 DIAGNOSIS — R001 Bradycardia, unspecified: Secondary | ICD-10-CM | POA: Diagnosis not present

## 2018-09-05 DIAGNOSIS — K449 Diaphragmatic hernia without obstruction or gangrene: Secondary | ICD-10-CM | POA: Diagnosis not present

## 2018-09-05 DIAGNOSIS — D509 Iron deficiency anemia, unspecified: Secondary | ICD-10-CM | POA: Diagnosis not present

## 2018-09-05 DIAGNOSIS — J189 Pneumonia, unspecified organism: Secondary | ICD-10-CM | POA: Diagnosis not present

## 2018-09-05 DIAGNOSIS — M069 Rheumatoid arthritis, unspecified: Secondary | ICD-10-CM | POA: Diagnosis not present

## 2018-09-05 DIAGNOSIS — N189 Chronic kidney disease, unspecified: Secondary | ICD-10-CM | POA: Diagnosis not present

## 2018-09-05 DIAGNOSIS — I10 Essential (primary) hypertension: Secondary | ICD-10-CM | POA: Diagnosis not present

## 2018-09-05 DIAGNOSIS — E1122 Type 2 diabetes mellitus with diabetic chronic kidney disease: Secondary | ICD-10-CM | POA: Diagnosis not present

## 2018-09-05 DIAGNOSIS — K5790 Diverticulosis of intestine, part unspecified, without perforation or abscess without bleeding: Secondary | ICD-10-CM | POA: Diagnosis not present

## 2018-09-05 DIAGNOSIS — M19049 Primary osteoarthritis, unspecified hand: Secondary | ICD-10-CM | POA: Diagnosis not present

## 2018-09-05 DIAGNOSIS — Z79899 Other long term (current) drug therapy: Secondary | ICD-10-CM | POA: Diagnosis not present

## 2018-09-05 DIAGNOSIS — I2510 Atherosclerotic heart disease of native coronary artery without angina pectoris: Secondary | ICD-10-CM | POA: Diagnosis not present

## 2018-09-05 DIAGNOSIS — I4891 Unspecified atrial fibrillation: Secondary | ICD-10-CM | POA: Diagnosis not present

## 2018-09-05 DIAGNOSIS — Z79891 Long term (current) use of opiate analgesic: Secondary | ICD-10-CM | POA: Diagnosis not present

## 2018-09-05 DIAGNOSIS — E7800 Pure hypercholesterolemia, unspecified: Secondary | ICD-10-CM | POA: Diagnosis not present

## 2018-09-07 DIAGNOSIS — M159 Polyosteoarthritis, unspecified: Secondary | ICD-10-CM | POA: Diagnosis not present

## 2018-09-07 DIAGNOSIS — K219 Gastro-esophageal reflux disease without esophagitis: Secondary | ICD-10-CM | POA: Diagnosis not present

## 2018-09-07 DIAGNOSIS — Z9981 Dependence on supplemental oxygen: Secondary | ICD-10-CM | POA: Diagnosis not present

## 2018-09-07 DIAGNOSIS — J189 Pneumonia, unspecified organism: Secondary | ICD-10-CM | POA: Diagnosis not present

## 2018-09-07 DIAGNOSIS — I129 Hypertensive chronic kidney disease with stage 1 through stage 4 chronic kidney disease, or unspecified chronic kidney disease: Secondary | ICD-10-CM | POA: Diagnosis not present

## 2018-09-07 DIAGNOSIS — N189 Chronic kidney disease, unspecified: Secondary | ICD-10-CM | POA: Diagnosis not present

## 2018-09-07 DIAGNOSIS — Z79899 Other long term (current) drug therapy: Secondary | ICD-10-CM | POA: Diagnosis not present

## 2018-09-07 DIAGNOSIS — I2510 Atherosclerotic heart disease of native coronary artery without angina pectoris: Secondary | ICD-10-CM | POA: Diagnosis not present

## 2018-09-07 DIAGNOSIS — M069 Rheumatoid arthritis, unspecified: Secondary | ICD-10-CM | POA: Diagnosis not present

## 2018-09-07 DIAGNOSIS — Z9049 Acquired absence of other specified parts of digestive tract: Secondary | ICD-10-CM | POA: Diagnosis not present

## 2018-09-07 DIAGNOSIS — Z79891 Long term (current) use of opiate analgesic: Secondary | ICD-10-CM | POA: Diagnosis not present

## 2018-09-07 DIAGNOSIS — I4891 Unspecified atrial fibrillation: Secondary | ICD-10-CM | POA: Diagnosis not present

## 2018-09-07 DIAGNOSIS — E1122 Type 2 diabetes mellitus with diabetic chronic kidney disease: Secondary | ICD-10-CM | POA: Diagnosis not present

## 2018-09-07 DIAGNOSIS — D509 Iron deficiency anemia, unspecified: Secondary | ICD-10-CM | POA: Diagnosis not present

## 2018-09-07 DIAGNOSIS — R001 Bradycardia, unspecified: Secondary | ICD-10-CM | POA: Diagnosis not present

## 2018-09-07 DIAGNOSIS — E7800 Pure hypercholesterolemia, unspecified: Secondary | ICD-10-CM | POA: Diagnosis not present

## 2018-09-07 DIAGNOSIS — K449 Diaphragmatic hernia without obstruction or gangrene: Secondary | ICD-10-CM | POA: Diagnosis not present

## 2018-09-07 DIAGNOSIS — K5790 Diverticulosis of intestine, part unspecified, without perforation or abscess without bleeding: Secondary | ICD-10-CM | POA: Diagnosis not present

## 2018-09-12 DIAGNOSIS — N189 Chronic kidney disease, unspecified: Secondary | ICD-10-CM | POA: Diagnosis not present

## 2018-09-12 DIAGNOSIS — Z79891 Long term (current) use of opiate analgesic: Secondary | ICD-10-CM | POA: Diagnosis not present

## 2018-09-12 DIAGNOSIS — I2510 Atherosclerotic heart disease of native coronary artery without angina pectoris: Secondary | ICD-10-CM | POA: Diagnosis not present

## 2018-09-12 DIAGNOSIS — D509 Iron deficiency anemia, unspecified: Secondary | ICD-10-CM | POA: Diagnosis not present

## 2018-09-12 DIAGNOSIS — K219 Gastro-esophageal reflux disease without esophagitis: Secondary | ICD-10-CM | POA: Diagnosis not present

## 2018-09-12 DIAGNOSIS — I129 Hypertensive chronic kidney disease with stage 1 through stage 4 chronic kidney disease, or unspecified chronic kidney disease: Secondary | ICD-10-CM | POA: Diagnosis not present

## 2018-09-12 DIAGNOSIS — M069 Rheumatoid arthritis, unspecified: Secondary | ICD-10-CM | POA: Diagnosis not present

## 2018-09-12 DIAGNOSIS — R001 Bradycardia, unspecified: Secondary | ICD-10-CM | POA: Diagnosis not present

## 2018-09-12 DIAGNOSIS — Z9049 Acquired absence of other specified parts of digestive tract: Secondary | ICD-10-CM | POA: Diagnosis not present

## 2018-09-12 DIAGNOSIS — Z79899 Other long term (current) drug therapy: Secondary | ICD-10-CM | POA: Diagnosis not present

## 2018-09-12 DIAGNOSIS — K449 Diaphragmatic hernia without obstruction or gangrene: Secondary | ICD-10-CM | POA: Diagnosis not present

## 2018-09-12 DIAGNOSIS — E7800 Pure hypercholesterolemia, unspecified: Secondary | ICD-10-CM | POA: Diagnosis not present

## 2018-09-12 DIAGNOSIS — E1122 Type 2 diabetes mellitus with diabetic chronic kidney disease: Secondary | ICD-10-CM | POA: Diagnosis not present

## 2018-09-12 DIAGNOSIS — M159 Polyosteoarthritis, unspecified: Secondary | ICD-10-CM | POA: Diagnosis not present

## 2018-09-12 DIAGNOSIS — J189 Pneumonia, unspecified organism: Secondary | ICD-10-CM | POA: Diagnosis not present

## 2018-09-12 DIAGNOSIS — Z9981 Dependence on supplemental oxygen: Secondary | ICD-10-CM | POA: Diagnosis not present

## 2018-09-12 DIAGNOSIS — K5790 Diverticulosis of intestine, part unspecified, without perforation or abscess without bleeding: Secondary | ICD-10-CM | POA: Diagnosis not present

## 2018-09-12 DIAGNOSIS — I4891 Unspecified atrial fibrillation: Secondary | ICD-10-CM | POA: Diagnosis not present

## 2018-09-14 ENCOUNTER — Encounter: Payer: Self-pay | Admitting: Cardiology

## 2018-09-14 ENCOUNTER — Telehealth (INDEPENDENT_AMBULATORY_CARE_PROVIDER_SITE_OTHER): Payer: Medicare Other | Admitting: Cardiology

## 2018-09-14 ENCOUNTER — Other Ambulatory Visit: Payer: Self-pay

## 2018-09-14 VITALS — BP 110/62 | HR 61 | Ht 63.0 in | Wt 166.0 lb

## 2018-09-14 DIAGNOSIS — I129 Hypertensive chronic kidney disease with stage 1 through stage 4 chronic kidney disease, or unspecified chronic kidney disease: Secondary | ICD-10-CM | POA: Diagnosis not present

## 2018-09-14 DIAGNOSIS — R001 Bradycardia, unspecified: Secondary | ICD-10-CM

## 2018-09-14 DIAGNOSIS — E7800 Pure hypercholesterolemia, unspecified: Secondary | ICD-10-CM | POA: Diagnosis not present

## 2018-09-14 DIAGNOSIS — I2510 Atherosclerotic heart disease of native coronary artery without angina pectoris: Secondary | ICD-10-CM | POA: Diagnosis not present

## 2018-09-14 DIAGNOSIS — E1122 Type 2 diabetes mellitus with diabetic chronic kidney disease: Secondary | ICD-10-CM | POA: Diagnosis not present

## 2018-09-14 DIAGNOSIS — I10 Essential (primary) hypertension: Secondary | ICD-10-CM | POA: Diagnosis not present

## 2018-09-14 DIAGNOSIS — N189 Chronic kidney disease, unspecified: Secondary | ICD-10-CM | POA: Diagnosis not present

## 2018-09-14 DIAGNOSIS — M069 Rheumatoid arthritis, unspecified: Secondary | ICD-10-CM | POA: Diagnosis not present

## 2018-09-14 DIAGNOSIS — Z79891 Long term (current) use of opiate analgesic: Secondary | ICD-10-CM | POA: Diagnosis not present

## 2018-09-14 DIAGNOSIS — Z9049 Acquired absence of other specified parts of digestive tract: Secondary | ICD-10-CM | POA: Diagnosis not present

## 2018-09-14 DIAGNOSIS — M159 Polyosteoarthritis, unspecified: Secondary | ICD-10-CM | POA: Diagnosis not present

## 2018-09-14 DIAGNOSIS — R42 Dizziness and giddiness: Secondary | ICD-10-CM

## 2018-09-14 DIAGNOSIS — K219 Gastro-esophageal reflux disease without esophagitis: Secondary | ICD-10-CM | POA: Diagnosis not present

## 2018-09-14 DIAGNOSIS — K5790 Diverticulosis of intestine, part unspecified, without perforation or abscess without bleeding: Secondary | ICD-10-CM | POA: Diagnosis not present

## 2018-09-14 DIAGNOSIS — D509 Iron deficiency anemia, unspecified: Secondary | ICD-10-CM | POA: Diagnosis not present

## 2018-09-14 DIAGNOSIS — J189 Pneumonia, unspecified organism: Secondary | ICD-10-CM | POA: Diagnosis not present

## 2018-09-14 DIAGNOSIS — Z79899 Other long term (current) drug therapy: Secondary | ICD-10-CM | POA: Diagnosis not present

## 2018-09-14 DIAGNOSIS — I4891 Unspecified atrial fibrillation: Secondary | ICD-10-CM | POA: Diagnosis not present

## 2018-09-14 DIAGNOSIS — Z9981 Dependence on supplemental oxygen: Secondary | ICD-10-CM | POA: Diagnosis not present

## 2018-09-14 DIAGNOSIS — K449 Diaphragmatic hernia without obstruction or gangrene: Secondary | ICD-10-CM | POA: Diagnosis not present

## 2018-09-14 NOTE — Patient Instructions (Signed)
Medication Instructions:  Your physician recommends that you continue on your current medications as directed. Please refer to the Current Medication list given to you today.  If you need a refill on your cardiac medications before your next appointment, please call your pharmacy.   Lab work: NONE If you have labs (blood work) drawn today and your tests are completely normal, you will receive your results only by: Marland Kitchen MyChart Message (if you have MyChart) OR . A paper copy in the mail If you have any lab test that is abnormal or we need to change your treatment, we will call you to review the results.  Testing/Procedures: Your physician has recommended that you wear a ZIO monitor. ZIO monitors are medical devices that record the heart's electrical activity. Doctors most often use these monitors to diagnose arrhythmias. Arrhythmias are problems with the speed or rhythm of the heartbeat. The monitor is a small, portable device. You can wear one while you do your normal daily activities. This is usually used to diagnose what is causing palpitations/syncope (passing out).You will wear this device for 14 days.    Follow-Up: At [facility], you and your health needs are our priority.  As part of our continuing mission to provide you with exceptional heart care, we have created designated Provider Care Teams.  These Care Teams include your primary Cardiologist (physician) and Advanced Practice Providers (APPs -  Physician Assistants and Nurse Practitioners) who all work together to provide you with the care you need, when you need it. You will need a follow up appointment in 2 months.

## 2018-09-14 NOTE — Progress Notes (Signed)
Virtual Visit via Telephone Note   This visit type was conducted due to national recommendations for restrictions regarding the COVID-19 Pandemic (e.g. social distancing) in an effort to limit this patient's exposure and mitigate transmission in our community.  Due to her co-morbid illnesses, this patient is at least at moderate risk for complications without adequate follow up.  This format is felt to be most appropriate for this patient at this time.  The patient did not have access to video technology/had technical difficulties with video requiring transitioning to audio format only (telephone).  All issues noted in this document were discussed and addressed.  No physical exam could be performed with this format.  Please refer to the patient's chart for her  consent to telehealth for [facility].   Date:  09/14/2018   ID:  Erika Rice, DOB Jan 21, 1925, MRN [id number]  Patient Location: Home Provider Location: Home  PCP:  Lise Auer, MD  Cardiologist:  No primary care provider on file.  Electrophysiologist:  None   Evaluation Performed:  Consultation - Erika Mayon was referred by Dr Welton Flakes for the evaluation of bradycardia.  Chief Complaint: Essential hypertension and bradycardia  History of Present Illness:    Erika Woodell is a 85 y.o. female with past medical history of essential hypertension.  There is a history of dementia mentioned in the chart.  Patient gave a good history on the phone.  She appears to be doing well for stated age.  I asked her about any dizziness or any fainting spells or any falls episodes.  She answered in the negative.  No chest pain orthopnea or PND.  I reviewed old records and echocardiogram was largely unremarkable.  The patient does not have symptoms concerning for COVID-19 infection (fever, chills, cough, or new shortness of breath).    Past Medical History:  Diagnosis Date  . Atrial fibrillation (HCC)   . Bradycardia   . COPD (chronic  obstructive pulmonary disease) (HCC)   . Degenerative joint disease   . Dementia (HCC)   . Diabetes mellitus type 2, noninsulin dependent (HCC)   . Dyspnea   . GERD (gastroesophageal reflux disease)   . Hypertension   . Lymphopenia   . Right lower lobe pulmonary infiltrate   . Syncope   . UTI (urinary tract infection)   . Weakness    Past Surgical History:  Procedure Laterality Date  . CHOLECYSTECTOMY    . HYSTERECTOMY ABDOMINAL WITH SALPINGECTOMY    . TONSILLECTOMY       Current Meds  Medication Sig  . ALPRAZolam (XANAX) 0.25 MG tablet Take 0.25 mg by mouth See admin instructions. Twice daily Before a nap or bedtime if needed for sleep  . amLODipine (NORVASC) 10 MG tablet Take 1 tablet (10 mg total) by mouth daily. (Patient taking differently: Take 5 mg by mouth daily. )  . celecoxib (CELEBREX) 200 MG capsule Take 200 mg by mouth daily.  . Cholecalciferol (VITAMIN D3) 1000 units CAPS Take 1 capsule by mouth daily.   Marland Kitchen escitalopram (LEXAPRO) 20 MG tablet Take 20 mg by mouth daily.   . famotidine (PEPCID) 10 MG tablet Take 10 mg by mouth at bedtime.  . fentaNYL (DURAGESIC) 25 MCG/HR Place 1 patch onto the skin every other day.  . gabapentin (NEURONTIN) 800 MG tablet Take 800 mg by mouth 3 (three) times daily.  . meclizine (ANTIVERT) 12.5 MG tablet Take 12.5 mg by mouth every 6 (six) hours as needed for dizziness.   Marland Kitchen  memantine (NAMENDA) 10 MG tablet Take 10 mg by mouth 2 (two) times daily.  . metroNIDAZOLE (FLAGYL) 500 MG tablet Take 1 tablet (500 mg total) by mouth every 8 (eight) hours.  Marland Kitchen oxyCODONE-acetaminophen (PERCOCET) 7.5-325 MG tablet Take 1 tablet by mouth every 8 (eight) hours as needed for moderate pain.   . pantoprazole (PROTONIX) 40 MG tablet Take 40 mg by mouth every morning.   . polyvinyl alcohol (LIQUIFILM TEARS) 1.4 % ophthalmic solution Place 1 drop into both eyes as needed for dry eyes.  Marland Kitchen risperiDONE (RISPERDAL) 0.25 MG tablet Take 0.5 mg by mouth at bedtime.    . vitamin B-12 (CYANOCOBALAMIN) 1000 MCG tablet Take 1,000 mcg by mouth daily.   . [DISCONTINUED] saccharomyces boulardii (FLORASTOR) 250 MG capsule Take 1 capsule (250 mg total) by mouth 2 (two) times daily.     Allergies:   Ace inhibitors and Sulfamethoxazole   Social History   Tobacco Use  . Smoking status: Never Smoker  . Smokeless tobacco: Never Used  Substance Use Topics  . Alcohol use: Never    Frequency: Never  . Drug use: Never     Family Hx: The patient's family history includes Cancer in her brother; Diabetes in her mother; Hypertension in her father and mother.  ROS:   Please see the history of present illness.    As mentioned above All other systems reviewed and are negative.   Prior CV studies:   The following studies were reviewed today:  I reviewed records from primary care physician and old records reports which were largely unremarkable.  Labs/Other Tests and Data Reviewed:    EKG:  No ECG reviewed.  Recent Labs: 07/07/2018: B Natriuretic Peptide 281.5 07/08/2018: Magnesium 1.8 07/09/2018: ALT 11; BUN 14; Creatinine, Ser 0.86; Hemoglobin 11.5; Platelets 248; Potassium 3.6; Sodium 142   Recent Lipid Panel No results found for: CHOL, TRIG, HDL, CHOLHDL, LDLCALC, LDLDIRECT  Wt Readings from Last 3 Encounters:  09/14/18 166 lb (75.3 kg)  07/06/18 180 lb (81.6 kg)  12/28/16 155 lb (70.3 kg)     Objective:    Vital Signs:  BP 110/62 (BP Location: Left Arm, Patient Position: Sitting, Cuff Size: Normal)   Pulse 61   Ht 5\' 3"  (1.6 m)   Wt 166 lb (75.3 kg)   BMI 29.41 kg/m    VITAL SIGNS:  reviewed  ASSESSMENT & PLAN:    1. Bradycardia arrhythmias: I agree with primary care physician.  Patient is asymptomatic but I would like to evaluate her with a two-week ZIO monitoring.  Also I will get blood work especially TSH reports from primary care physician and a copy of the EKG which I do not possess at the present time. 2. She will be seen in follow-up  appointment in 2 months or earlier if she has any concerns.  She knows to call us if she has any significant concerns before this.  I am not particularly keen on bringing her in for an echocardiogram at this time in view of the current viral pandemic and her advanced age.  COVID-19 Education: The signs and symptoms of COVID-19 were discussed with the patient and how to seek care for testing (follow up with PCP or arrange E-visit).  The importance of social distancing was discussed today.  Time:   Today, I have spent 34 minutes with the patient with telehealth technology discussing the above problems.  Total time for above evaluation was 45 minutes and this included chart review.   Medication  Adjustments/Labs and Tests Ordered: Current medicines are reviewed at length with the patient today.  Concerns regarding medicines are outlined above.   Tests Ordered: No orders of the defined types were placed in this encounter.   Medication Changes: No orders of the defined types were placed in this encounter.   Follow Up:  Virtual Visit in 2 month(s)  Signed, Garwin Brothers, MD  09/14/2018 2:10 PM    [facility]

## 2018-09-14 NOTE — Addendum Note (Signed)
Addended by: Beckey Rutter on: 09/14/2018 02:53 PM   Modules accepted: Orders

## 2018-09-18 DIAGNOSIS — N189 Chronic kidney disease, unspecified: Secondary | ICD-10-CM | POA: Diagnosis not present

## 2018-09-18 DIAGNOSIS — E1122 Type 2 diabetes mellitus with diabetic chronic kidney disease: Secondary | ICD-10-CM | POA: Diagnosis not present

## 2018-09-18 DIAGNOSIS — R001 Bradycardia, unspecified: Secondary | ICD-10-CM | POA: Diagnosis not present

## 2018-09-18 DIAGNOSIS — I129 Hypertensive chronic kidney disease with stage 1 through stage 4 chronic kidney disease, or unspecified chronic kidney disease: Secondary | ICD-10-CM | POA: Diagnosis not present

## 2018-09-18 DIAGNOSIS — D509 Iron deficiency anemia, unspecified: Secondary | ICD-10-CM | POA: Diagnosis not present

## 2018-09-18 DIAGNOSIS — Z79891 Long term (current) use of opiate analgesic: Secondary | ICD-10-CM | POA: Diagnosis not present

## 2018-09-18 DIAGNOSIS — I2510 Atherosclerotic heart disease of native coronary artery without angina pectoris: Secondary | ICD-10-CM | POA: Diagnosis not present

## 2018-09-18 DIAGNOSIS — E7800 Pure hypercholesterolemia, unspecified: Secondary | ICD-10-CM | POA: Diagnosis not present

## 2018-09-18 DIAGNOSIS — K219 Gastro-esophageal reflux disease without esophagitis: Secondary | ICD-10-CM | POA: Diagnosis not present

## 2018-09-18 DIAGNOSIS — Z9981 Dependence on supplemental oxygen: Secondary | ICD-10-CM | POA: Diagnosis not present

## 2018-09-18 DIAGNOSIS — M159 Polyosteoarthritis, unspecified: Secondary | ICD-10-CM | POA: Diagnosis not present

## 2018-09-18 DIAGNOSIS — K449 Diaphragmatic hernia without obstruction or gangrene: Secondary | ICD-10-CM | POA: Diagnosis not present

## 2018-09-18 DIAGNOSIS — Z79899 Other long term (current) drug therapy: Secondary | ICD-10-CM | POA: Diagnosis not present

## 2018-09-18 DIAGNOSIS — M069 Rheumatoid arthritis, unspecified: Secondary | ICD-10-CM | POA: Diagnosis not present

## 2018-09-18 DIAGNOSIS — Z9049 Acquired absence of other specified parts of digestive tract: Secondary | ICD-10-CM | POA: Diagnosis not present

## 2018-09-18 DIAGNOSIS — I4891 Unspecified atrial fibrillation: Secondary | ICD-10-CM | POA: Diagnosis not present

## 2018-09-18 DIAGNOSIS — K5790 Diverticulosis of intestine, part unspecified, without perforation or abscess without bleeding: Secondary | ICD-10-CM | POA: Diagnosis not present

## 2018-09-18 DIAGNOSIS — J189 Pneumonia, unspecified organism: Secondary | ICD-10-CM | POA: Diagnosis not present

## 2018-09-21 ENCOUNTER — Other Ambulatory Visit (INDEPENDENT_AMBULATORY_CARE_PROVIDER_SITE_OTHER): Payer: Medicare Other

## 2018-09-21 DIAGNOSIS — R001 Bradycardia, unspecified: Secondary | ICD-10-CM

## 2018-09-21 DIAGNOSIS — E1122 Type 2 diabetes mellitus with diabetic chronic kidney disease: Secondary | ICD-10-CM | POA: Diagnosis not present

## 2018-09-21 DIAGNOSIS — K5790 Diverticulosis of intestine, part unspecified, without perforation or abscess without bleeding: Secondary | ICD-10-CM | POA: Diagnosis not present

## 2018-09-21 DIAGNOSIS — I4891 Unspecified atrial fibrillation: Secondary | ICD-10-CM | POA: Diagnosis not present

## 2018-09-21 DIAGNOSIS — N189 Chronic kidney disease, unspecified: Secondary | ICD-10-CM | POA: Diagnosis not present

## 2018-09-21 DIAGNOSIS — E7800 Pure hypercholesterolemia, unspecified: Secondary | ICD-10-CM | POA: Diagnosis not present

## 2018-09-21 DIAGNOSIS — Z9981 Dependence on supplemental oxygen: Secondary | ICD-10-CM | POA: Diagnosis not present

## 2018-09-21 DIAGNOSIS — Z9049 Acquired absence of other specified parts of digestive tract: Secondary | ICD-10-CM | POA: Diagnosis not present

## 2018-09-21 DIAGNOSIS — K449 Diaphragmatic hernia without obstruction or gangrene: Secondary | ICD-10-CM | POA: Diagnosis not present

## 2018-09-21 DIAGNOSIS — I2510 Atherosclerotic heart disease of native coronary artery without angina pectoris: Secondary | ICD-10-CM | POA: Diagnosis not present

## 2018-09-21 DIAGNOSIS — Z79899 Other long term (current) drug therapy: Secondary | ICD-10-CM | POA: Diagnosis not present

## 2018-09-21 DIAGNOSIS — K219 Gastro-esophageal reflux disease without esophagitis: Secondary | ICD-10-CM | POA: Diagnosis not present

## 2018-09-21 DIAGNOSIS — Z79891 Long term (current) use of opiate analgesic: Secondary | ICD-10-CM | POA: Diagnosis not present

## 2018-09-21 DIAGNOSIS — J189 Pneumonia, unspecified organism: Secondary | ICD-10-CM | POA: Diagnosis not present

## 2018-09-21 DIAGNOSIS — I129 Hypertensive chronic kidney disease with stage 1 through stage 4 chronic kidney disease, or unspecified chronic kidney disease: Secondary | ICD-10-CM | POA: Diagnosis not present

## 2018-09-21 DIAGNOSIS — M159 Polyosteoarthritis, unspecified: Secondary | ICD-10-CM | POA: Diagnosis not present

## 2018-09-21 DIAGNOSIS — M069 Rheumatoid arthritis, unspecified: Secondary | ICD-10-CM | POA: Diagnosis not present

## 2018-09-21 DIAGNOSIS — D509 Iron deficiency anemia, unspecified: Secondary | ICD-10-CM | POA: Diagnosis not present

## 2018-09-23 DIAGNOSIS — R0902 Hypoxemia: Secondary | ICD-10-CM | POA: Diagnosis not present

## 2018-09-26 DIAGNOSIS — D509 Iron deficiency anemia, unspecified: Secondary | ICD-10-CM | POA: Diagnosis not present

## 2018-09-26 DIAGNOSIS — I10 Essential (primary) hypertension: Secondary | ICD-10-CM | POA: Diagnosis not present

## 2018-09-26 DIAGNOSIS — M159 Polyosteoarthritis, unspecified: Secondary | ICD-10-CM | POA: Diagnosis not present

## 2018-09-26 DIAGNOSIS — I129 Hypertensive chronic kidney disease with stage 1 through stage 4 chronic kidney disease, or unspecified chronic kidney disease: Secondary | ICD-10-CM | POA: Diagnosis not present

## 2018-09-26 DIAGNOSIS — K449 Diaphragmatic hernia without obstruction or gangrene: Secondary | ICD-10-CM | POA: Diagnosis not present

## 2018-09-26 DIAGNOSIS — E7800 Pure hypercholesterolemia, unspecified: Secondary | ICD-10-CM | POA: Diagnosis not present

## 2018-09-26 DIAGNOSIS — E1122 Type 2 diabetes mellitus with diabetic chronic kidney disease: Secondary | ICD-10-CM | POA: Diagnosis not present

## 2018-09-26 DIAGNOSIS — K5790 Diverticulosis of intestine, part unspecified, without perforation or abscess without bleeding: Secondary | ICD-10-CM | POA: Diagnosis not present

## 2018-09-26 DIAGNOSIS — R001 Bradycardia, unspecified: Secondary | ICD-10-CM | POA: Diagnosis not present

## 2018-09-26 DIAGNOSIS — N189 Chronic kidney disease, unspecified: Secondary | ICD-10-CM | POA: Diagnosis not present

## 2018-09-26 DIAGNOSIS — K219 Gastro-esophageal reflux disease without esophagitis: Secondary | ICD-10-CM | POA: Diagnosis not present

## 2018-09-26 DIAGNOSIS — M069 Rheumatoid arthritis, unspecified: Secondary | ICD-10-CM | POA: Diagnosis not present

## 2018-09-26 DIAGNOSIS — Z9049 Acquired absence of other specified parts of digestive tract: Secondary | ICD-10-CM | POA: Diagnosis not present

## 2018-09-26 DIAGNOSIS — Z79891 Long term (current) use of opiate analgesic: Secondary | ICD-10-CM | POA: Diagnosis not present

## 2018-09-26 DIAGNOSIS — Z79899 Other long term (current) drug therapy: Secondary | ICD-10-CM | POA: Diagnosis not present

## 2018-09-26 DIAGNOSIS — J189 Pneumonia, unspecified organism: Secondary | ICD-10-CM | POA: Diagnosis not present

## 2018-09-26 DIAGNOSIS — I2510 Atherosclerotic heart disease of native coronary artery without angina pectoris: Secondary | ICD-10-CM | POA: Diagnosis not present

## 2018-09-26 DIAGNOSIS — Z9981 Dependence on supplemental oxygen: Secondary | ICD-10-CM | POA: Diagnosis not present

## 2018-09-26 DIAGNOSIS — I4891 Unspecified atrial fibrillation: Secondary | ICD-10-CM | POA: Diagnosis not present

## 2018-09-28 DIAGNOSIS — D509 Iron deficiency anemia, unspecified: Secondary | ICD-10-CM | POA: Diagnosis not present

## 2018-09-28 DIAGNOSIS — I2510 Atherosclerotic heart disease of native coronary artery without angina pectoris: Secondary | ICD-10-CM | POA: Diagnosis not present

## 2018-09-28 DIAGNOSIS — K449 Diaphragmatic hernia without obstruction or gangrene: Secondary | ICD-10-CM | POA: Diagnosis not present

## 2018-09-28 DIAGNOSIS — R001 Bradycardia, unspecified: Secondary | ICD-10-CM | POA: Diagnosis not present

## 2018-09-28 DIAGNOSIS — Z79891 Long term (current) use of opiate analgesic: Secondary | ICD-10-CM | POA: Diagnosis not present

## 2018-09-28 DIAGNOSIS — I4891 Unspecified atrial fibrillation: Secondary | ICD-10-CM | POA: Diagnosis not present

## 2018-09-28 DIAGNOSIS — J189 Pneumonia, unspecified organism: Secondary | ICD-10-CM | POA: Diagnosis not present

## 2018-09-28 DIAGNOSIS — Z9049 Acquired absence of other specified parts of digestive tract: Secondary | ICD-10-CM | POA: Diagnosis not present

## 2018-09-28 DIAGNOSIS — N189 Chronic kidney disease, unspecified: Secondary | ICD-10-CM | POA: Diagnosis not present

## 2018-09-28 DIAGNOSIS — K219 Gastro-esophageal reflux disease without esophagitis: Secondary | ICD-10-CM | POA: Diagnosis not present

## 2018-09-28 DIAGNOSIS — Z9981 Dependence on supplemental oxygen: Secondary | ICD-10-CM | POA: Diagnosis not present

## 2018-09-28 DIAGNOSIS — Z79899 Other long term (current) drug therapy: Secondary | ICD-10-CM | POA: Diagnosis not present

## 2018-09-28 DIAGNOSIS — K5790 Diverticulosis of intestine, part unspecified, without perforation or abscess without bleeding: Secondary | ICD-10-CM | POA: Diagnosis not present

## 2018-09-28 DIAGNOSIS — M069 Rheumatoid arthritis, unspecified: Secondary | ICD-10-CM | POA: Diagnosis not present

## 2018-09-28 DIAGNOSIS — M159 Polyosteoarthritis, unspecified: Secondary | ICD-10-CM | POA: Diagnosis not present

## 2018-09-28 DIAGNOSIS — E1122 Type 2 diabetes mellitus with diabetic chronic kidney disease: Secondary | ICD-10-CM | POA: Diagnosis not present

## 2018-09-28 DIAGNOSIS — E7800 Pure hypercholesterolemia, unspecified: Secondary | ICD-10-CM | POA: Diagnosis not present

## 2018-09-28 DIAGNOSIS — I129 Hypertensive chronic kidney disease with stage 1 through stage 4 chronic kidney disease, or unspecified chronic kidney disease: Secondary | ICD-10-CM | POA: Diagnosis not present

## 2018-10-09 DIAGNOSIS — R001 Bradycardia, unspecified: Secondary | ICD-10-CM | POA: Diagnosis not present

## 2018-10-12 ENCOUNTER — Telehealth: Payer: Self-pay | Admitting: *Deleted

## 2018-10-12 DIAGNOSIS — R001 Bradycardia, unspecified: Secondary | ICD-10-CM | POA: Diagnosis not present

## 2018-10-12 DIAGNOSIS — M069 Rheumatoid arthritis, unspecified: Secondary | ICD-10-CM | POA: Diagnosis not present

## 2018-10-12 DIAGNOSIS — Z9049 Acquired absence of other specified parts of digestive tract: Secondary | ICD-10-CM | POA: Diagnosis not present

## 2018-10-12 DIAGNOSIS — K219 Gastro-esophageal reflux disease without esophagitis: Secondary | ICD-10-CM | POA: Diagnosis not present

## 2018-10-12 DIAGNOSIS — I129 Hypertensive chronic kidney disease with stage 1 through stage 4 chronic kidney disease, or unspecified chronic kidney disease: Secondary | ICD-10-CM | POA: Diagnosis not present

## 2018-10-12 DIAGNOSIS — M159 Polyosteoarthritis, unspecified: Secondary | ICD-10-CM | POA: Diagnosis not present

## 2018-10-12 DIAGNOSIS — J189 Pneumonia, unspecified organism: Secondary | ICD-10-CM | POA: Diagnosis not present

## 2018-10-12 DIAGNOSIS — Z9981 Dependence on supplemental oxygen: Secondary | ICD-10-CM | POA: Diagnosis not present

## 2018-10-12 DIAGNOSIS — D509 Iron deficiency anemia, unspecified: Secondary | ICD-10-CM | POA: Diagnosis not present

## 2018-10-12 DIAGNOSIS — E1122 Type 2 diabetes mellitus with diabetic chronic kidney disease: Secondary | ICD-10-CM | POA: Diagnosis not present

## 2018-10-12 DIAGNOSIS — I2510 Atherosclerotic heart disease of native coronary artery without angina pectoris: Secondary | ICD-10-CM | POA: Diagnosis not present

## 2018-10-12 DIAGNOSIS — N189 Chronic kidney disease, unspecified: Secondary | ICD-10-CM | POA: Diagnosis not present

## 2018-10-12 DIAGNOSIS — Z79891 Long term (current) use of opiate analgesic: Secondary | ICD-10-CM | POA: Diagnosis not present

## 2018-10-12 DIAGNOSIS — E7800 Pure hypercholesterolemia, unspecified: Secondary | ICD-10-CM | POA: Diagnosis not present

## 2018-10-12 DIAGNOSIS — K5790 Diverticulosis of intestine, part unspecified, without perforation or abscess without bleeding: Secondary | ICD-10-CM | POA: Diagnosis not present

## 2018-10-12 DIAGNOSIS — K449 Diaphragmatic hernia without obstruction or gangrene: Secondary | ICD-10-CM | POA: Diagnosis not present

## 2018-10-12 DIAGNOSIS — Z79899 Other long term (current) drug therapy: Secondary | ICD-10-CM | POA: Diagnosis not present

## 2018-10-12 DIAGNOSIS — I4891 Unspecified atrial fibrillation: Secondary | ICD-10-CM | POA: Diagnosis not present

## 2018-10-12 NOTE — Telephone Encounter (Signed)
Forwarding back to [place], CMA. Abbie, we have not called this patient.  Referral has not been placed for EP.  If you refer to monitor result - Dr. Geraldo Pitter spoke with this pt 2 days ago.  He mentions EP referral in that note, but referral has not been placed. Please have someone place referral and we can call and arrange consult.  Someone from your office will need to update this patient. Thanks

## 2018-10-12 NOTE — Telephone Encounter (Signed)
Pt's son on phone and states was on with someone about having pacemaker put in pt but got cut off 2 days ago. Please call and advise.

## 2018-10-23 DIAGNOSIS — R0902 Hypoxemia: Secondary | ICD-10-CM | POA: Diagnosis not present

## 2018-10-26 DIAGNOSIS — Z79899 Other long term (current) drug therapy: Secondary | ICD-10-CM | POA: Diagnosis not present

## 2018-10-26 DIAGNOSIS — D509 Iron deficiency anemia, unspecified: Secondary | ICD-10-CM | POA: Diagnosis not present

## 2018-10-26 DIAGNOSIS — I4891 Unspecified atrial fibrillation: Secondary | ICD-10-CM | POA: Diagnosis not present

## 2018-10-26 DIAGNOSIS — N189 Chronic kidney disease, unspecified: Secondary | ICD-10-CM | POA: Diagnosis not present

## 2018-10-26 DIAGNOSIS — K449 Diaphragmatic hernia without obstruction or gangrene: Secondary | ICD-10-CM | POA: Diagnosis not present

## 2018-10-26 DIAGNOSIS — J189 Pneumonia, unspecified organism: Secondary | ICD-10-CM | POA: Diagnosis not present

## 2018-10-26 DIAGNOSIS — I2510 Atherosclerotic heart disease of native coronary artery without angina pectoris: Secondary | ICD-10-CM | POA: Diagnosis not present

## 2018-10-26 DIAGNOSIS — K5790 Diverticulosis of intestine, part unspecified, without perforation or abscess without bleeding: Secondary | ICD-10-CM | POA: Diagnosis not present

## 2018-10-26 DIAGNOSIS — M069 Rheumatoid arthritis, unspecified: Secondary | ICD-10-CM | POA: Diagnosis not present

## 2018-10-26 DIAGNOSIS — R001 Bradycardia, unspecified: Secondary | ICD-10-CM | POA: Diagnosis not present

## 2018-10-26 DIAGNOSIS — M159 Polyosteoarthritis, unspecified: Secondary | ICD-10-CM | POA: Diagnosis not present

## 2018-10-26 DIAGNOSIS — Z79891 Long term (current) use of opiate analgesic: Secondary | ICD-10-CM | POA: Diagnosis not present

## 2018-10-26 DIAGNOSIS — E7800 Pure hypercholesterolemia, unspecified: Secondary | ICD-10-CM | POA: Diagnosis not present

## 2018-10-26 DIAGNOSIS — Z9049 Acquired absence of other specified parts of digestive tract: Secondary | ICD-10-CM | POA: Diagnosis not present

## 2018-10-26 DIAGNOSIS — I129 Hypertensive chronic kidney disease with stage 1 through stage 4 chronic kidney disease, or unspecified chronic kidney disease: Secondary | ICD-10-CM | POA: Diagnosis not present

## 2018-10-26 DIAGNOSIS — Z9981 Dependence on supplemental oxygen: Secondary | ICD-10-CM | POA: Diagnosis not present

## 2018-10-26 DIAGNOSIS — E1122 Type 2 diabetes mellitus with diabetic chronic kidney disease: Secondary | ICD-10-CM | POA: Diagnosis not present

## 2018-10-26 DIAGNOSIS — K219 Gastro-esophageal reflux disease without esophagitis: Secondary | ICD-10-CM | POA: Diagnosis not present

## 2018-10-27 DIAGNOSIS — M19049 Primary osteoarthritis, unspecified hand: Secondary | ICD-10-CM | POA: Diagnosis not present

## 2018-10-27 DIAGNOSIS — E785 Hyperlipidemia, unspecified: Secondary | ICD-10-CM | POA: Diagnosis not present

## 2018-10-27 NOTE — Telephone Encounter (Signed)
Looks like referral was placed to EP, but for some reason it was taken/used, but not by EP. Dr. Geraldo Pitter - do you still need this pt seen?  Let me know and I will ensure Lenna Sciara gets them scheduled. Thanks

## 2018-10-29 NOTE — Telephone Encounter (Signed)
Yes and soon!

## 2018-10-30 NOTE — Telephone Encounter (Signed)
Forwarding to scheduler to arrange consult

## 2018-11-01 ENCOUNTER — Telehealth: Payer: Self-pay

## 2018-11-01 NOTE — Telephone Encounter (Signed)
Patient son called and states Erika Rice HR is 38. She is sleeping a lot and fatigued. BP 157/74 . [id number]. Patient is taking percocet every 8 hours along with norvasc 5 mg a day. Son feels heart rate is getting too low and wanted Dr. Docia Furl to be aware/advise.

## 2018-11-01 NOTE — Telephone Encounter (Signed)
She needs to go to urgent care center or primary care physician to be evaluated.  May be her Percocet her is causing the symptoms also she needs to get an EKG when evaluated by primary care or urgent care center to see if she needs any further evaluation for this.  Should be done ASAP.

## 2018-11-01 NOTE — Telephone Encounter (Signed)
Information relayed and Nicki Reaper states he will call Dr. Trish Mage office to see if they can see her. Urgency was expressed by RN.

## 2018-11-07 ENCOUNTER — Encounter: Payer: Self-pay | Admitting: Cardiology

## 2018-11-07 ENCOUNTER — Other Ambulatory Visit (HOSPITAL_COMMUNITY)
Admission: RE | Admit: 2018-11-07 | Discharge: 2018-11-07 | Disposition: A | Discharge status: Home / Self Care | Payer: Medicare Other | Source: Ambulatory Visit | Attending: Cardiology | Admitting: Cardiology

## 2018-11-07 ENCOUNTER — Other Ambulatory Visit: Payer: Self-pay

## 2018-11-07 ENCOUNTER — Ambulatory Visit (INDEPENDENT_AMBULATORY_CARE_PROVIDER_SITE_OTHER): Payer: Medicare Other | Admitting: Cardiology

## 2018-11-07 VITALS — BP 112/60 | HR 34 | Ht 63.0 in | Wt 166.0 lb

## 2018-11-07 DIAGNOSIS — I495 Sick sinus syndrome: Secondary | ICD-10-CM | POA: Diagnosis not present

## 2018-11-07 DIAGNOSIS — Z01812 Encounter for preprocedural laboratory examination: Secondary | ICD-10-CM

## 2018-11-07 DIAGNOSIS — Z20828 Contact with and (suspected) exposure to other viral communicable diseases: Secondary | ICD-10-CM | POA: Insufficient documentation

## 2018-11-07 LAB — SARS CORONAVIRUS 2 (TAT 6-24 HRS): SARS Coronavirus 2: NEGATIVE

## 2018-11-07 LAB — BASIC METABOLIC PANEL
BUN/Creatinine Ratio: 14 (ref 12–28)
BUN: 15 mg/dL (ref 10–36)
CO2: 28 mmol/L (ref 20–29)
Calcium: 10.3 mg/dL (ref 8.7–10.3)
Chloride: 100 mmol/L (ref 96–106)
Creatinine, Ser: 1.07 mg/dL — ABNORMAL HIGH (ref 0.57–1.00)
GFR calc Af Amer: 52 mL/min/{1.73_m2} — ABNORMAL LOW (ref >59)
GFR calc non Af Amer: 45 mL/min/{1.73_m2} — ABNORMAL LOW (ref >59)
Glucose: 117 mg/dL — ABNORMAL HIGH (ref 65–99)
Potassium: 5.2 mmol/L (ref 3.5–5.2)
Sodium: 142 mmol/L (ref 134–144)

## 2018-11-07 LAB — CBC
Hematocrit: 41.5 % (ref 34.0–46.6)
Hemoglobin: 13.4 g/dL (ref 11.1–15.9)
MCH: 30.8 pg (ref 26.6–33.0)
MCHC: 32.3 g/dL (ref 31.5–35.7)
MCV: 95 fL (ref 79–97)
Platelets: 209 10*3/uL (ref 150–450)
RBC: 4.35 x10E6/uL (ref 3.77–5.28)
RDW: 11.6 % — ABNORMAL LOW (ref 11.7–15.4)
WBC: 4.6 10*3/uL (ref 3.4–10.8)

## 2018-11-07 NOTE — Patient Instructions (Signed)
Medication Instructions:  Your physician recommends that you continue on your current medications as directed. Please refer to the Current Medication list given to you today.     * If you need a refill on your cardiac medications before your next appointment, please call your pharmacy. *   Labwork: Pre procedure lab work today: BMET & CBC w/ diff * Will notify you of abnormal results, otherwise continue current treatment plan.*  Go to [facility] after you leave here for pre procedure COVID testing.  ([address])  This is a drive through testing site so you will remain in your car with a mask on. Be sure to share with the first checkpoint that you are there for pre-procedure/surgery testing.  This will put you into the right (yellow) lane that leads to the PAT testing team.   Testing/Procedures: Your physician has recommended that you have a pacemaker inserted. A pacemaker is a small device that is placed under the skin of your chest or abdomen to help control abnormal heart rhythms. This device uses electrical pulses to prompt the heart to beat at a normal rate. Pacemakers are used to treat heart rhythms that are too slow. Wire (leads) are attached to the pacemaker that goes into the chambers of you heart. This is done in the hospital and usually requires and overnight stay. Please follow the instructions below, located under the special instructions section.   Follow-Up: Your physician recommends that you schedule a wound check appointment 10-14 days, after your procedure on 11/10/18, with the device clinic.  Your physician recommends that you schedule a follow up appointment in 91 days, after your procedure on 11/10/18, with Dr. Elberta Fortis.  * Please note that any paperwork needing to be filled out by the provider will need to be addressed at the front desk prior to seeing the provider.  Please note that any FMLA, disability or other documents regarding health condition is  subject to a $25.00 charge that must be received prior to completion of paperwork in the form of a money order or check. *  Thank you for choosing [facility]!!   Dory Horn, RN [phone]   Any Other Special Instructions Will Be Listed Below (If Applicable).     Implantable Device Instructions  You are scheduled for:                  _____ Permanent Transvenous Pacemaker  on  11/10/18  with Dr. Elberta Fortis.  1.   Please arrive at the [facility], Entrance "A"  at [facility] at  10:30 a.m. on the day of your procedure. (The address is [address])  2. Do not eat or drink after midnight the night before your procedure.  3.   Complete pre procedure  lab work on 11/07/18.  You do not have to be fasting.  4.   All of your medications may be taken with a small amount of water the morning of your procedure.  5.  Plan for an overnight stay.  Bring your insurance cards and a list of you medications.  6.  Wash your chest and neck with surgical scrub the evening before and the morning of your procedure.  Rinse well. Please review the surgical scrub instruction sheet given to you.  7. Your chest will need to be shaved prior to this procedure (if needed). We ask that you do this yourself at home 1 to 2 days before or if uncomfortable/unable  to do yourself, then it will be performed by the hospital staff the day of.                                                                                                                * If you have ANY questions after you get home, please call Dory HornSherri Romy Ipock, RN @ [phone].  * Every attempt is made to prevent procedures from being rescheduled.  Due to the nature of  Electrophysiology, rescheduling can happen.  The physician is always aware and directs the staff when this occurs.     Pacemaker Implantation, Adult Pacemaker implantation is a procedure to place a pacemaker inside your chest. A pacemaker is a small  computer that sends electrical signals to the heart and helps your heart beat normally. A pacemaker also stores information about your heart rhythms. You may need pacemaker implantation if you:  Have a slow heartbeat (bradycardia).  Faint (syncope).  Have shortness of breath (dyspnea) due to heart problems.  The pacemaker attaches to your heart through a wire, called a lead. Sometimes just one lead is needed. Other times, there will be two leads. There are two types of pacemakers:  Transvenous pacemaker. This type is placed under the skin or muscle of your chest. The lead goes through a vein in the chest area to reach the inside of the heart.  Epicardial pacemaker. This type is placed under the skin or muscle of your chest or belly. The lead goes through your chest to the outside of the heart.  Tell a health care provider about:  Any allergies you have.  All medicines you are taking, including vitamins, herbs, eye drops, creams, and over-the-counter medicines.  Any problems you or family members have had with anesthetic medicines.  Any blood or bone disorders you have.  Any surgeries you have had.  Any medical conditions you have.  Whether you are pregnant or may be pregnant. What are the risks? Generally, this is a safe procedure. However, problems may occur, including:  Infection.  Bleeding.  Failure of the pacemaker or the lead.  Collapse of a lung or bleeding into a lung.  Blood clot inside a blood vessel with a lead.  Damage to the heart.  Infection inside the heart (endocarditis).  Allergic reactions to medicines.  What happens before the procedure? Staying hydrated Follow instructions from your health care provider about hydration, which may include:  Up to 2 hours before the procedure - you may continue to drink clear liquids, such as water, clear fruit juice, black coffee, and plain tea.  Eating and drinking restrictions Follow instructions from your  health care provider about eating and drinking, which may include:  8 hours before the procedure - stop eating heavy meals or foods such as meat, fried foods, or fatty foods.  6 hours before the procedure - stop eating light meals or foods, such as toast or cereal.  6 hours before the procedure - stop drinking milk or drinks that contain milk.  2 hours before the procedure - stop drinking clear liquids.  Medicines  Ask your health care provider about: ? Changing or stopping your regular medicines. This is especially important if you are taking diabetes medicines or blood thinners. ? Taking medicines such as aspirin and ibuprofen. These medicines can thin your blood. Do not take these medicines before your procedure if your health care provider instructs you not to.  You may be given antibiotic medicine to help prevent infection. General instructions  You will have a heart evaluation. This may include an electrocardiogram (ECG), chest X-ray, and heart imaging (echocardiogram,  or echo) tests.  You will have blood tests.  Do not use any products that contain nicotine or tobacco, such as cigarettes and e-cigarettes. If you need help quitting, ask your health care provider.  Plan to have someone take you home from the hospital or clinic.  If you will be going home right after the procedure, plan to have someone with you for 24 hours.  Ask your health care provider how your surgical site will be marked or identified. What happens during the procedure?  To reduce your risk of infection: ? Your health care team will wash or sanitize their hands. ? Your skin will be washed with soap. ? Hair may be removed from the surgical area.  An IV tube will be inserted into one of your veins.  You will be given one or more of the following: ? A medicine to help you relax (sedative). ? A medicine to numb the area (local anesthetic). ? A medicine to make you fall asleep (general anesthetic).   If you are getting a transvenous pacemaker: ? An incision will be made in your upper chest. ? A pocket will be made for the pacemaker. It may be placed under the skin or between layers of muscle. ? The lead will be inserted into a blood vessel that returns to the heart. ? While X-rays are taken by an imaging machine (fluoroscopy), the lead will be advanced through the vein to the inside of your heart. ? The other end of the lead will be tunneled under the skin and attached to the pacemaker.  If you are getting an epicardial pacemaker: ? An incision will be made near your ribs or breastbone (sternum) for the lead. ? The lead will be attached to the outside of your heart. ? Another incision will be made in your chest or upper belly to create a pocket for the pacemaker. ? The free end of the lead will be tunneled under the skin and attached to the pacemaker.  The transvenous or epicardial pacemaker will be tested. Imaging studies may be done to check the lead position.  The incisions will be closed with stitches (sutures), adhesive strips, or skin glue.  Bandages (dressing) will be placed over the incisions. The procedure may vary among health care providers and hospitals. What happens after the procedure?  Your blood pressure, heart rate, breathing rate, and blood oxygen level will be monitored until the medicines you were given have worn off.  You will be given antibiotics and pain medicine.  ECG and chest x-rays will be done.  You will wear a continuous type of ECG (Holter monitor) to check your heart rhythm.  Your health care provider will program the pacemaker.  Do not drive for 24 hours if you received a sedative. This information is not intended to replace advice given to you by your health care provider. Make sure you discuss  any questions you have with your health care provider. Document Released: 03/05/2002 Document Revised: 10/03/2015 Document Reviewed: 08/27/2015 Elsevier  Interactive Patient Education  2018 [employer].     Pacemaker Implantation, Adult, Care After This sheet gives you information about how to care for yourself after your procedure. Your health care provider may also give you more specific instructions. If you have problems or questions, contact your health care provider. What can I expect after the procedure? After the procedure, it is common to have:  Mild pain.  Slight bruising.  Some swelling over the incision.  A slight bump over the skin where the device was placed. Sometimes, it is possible to feel the device under the skin. This is normal.  Follow these instructions at home: Medicines  Take over-the-counter and prescription medicines only as told by your health care provider.  If you were prescribed an antibiotic medicine, take it as told by your health care provider. Do not stop taking the antibiotic even if you start to feel better. Wound care  Do not remove the bandage on your chest until directed to do so by your health care provider.  After your bandage is removed, you may see pieces of tape called skin adhesive strips over the area where the cut was made (incision site). Let them fall off on their own.  Check the incision site every day to make sure it is not infected, bleeding, or starting to pull apart.  Do not use lotions or ointments near the incision site unless directed to do so.  Keep the incision area clean and dry for 2-3 days after the procedure or as directed by your health care provider. It takes several weeks for the incision site to completely heal.  Do not take baths, swim, or use a hot tub for 7-10 days or as otherwise directed by your health care provider. Activity  Do not drive or use heavy machinery while taking prescription pain medicine.  Do not drive for 24 hours if you were given a medicine to help you relax (sedative).  Check with your health care provider before you start to drive or  play sports.  Avoid sudden jerking, pulling, or chopping movements that pull your upper arm far away from your body. Avoid these movements for at least 6 weeks or as long as told by your health care provider.  Do not lift your upper arm above your shoulders for at least 6 weeks or as long as told by your health care provider. This means no tennis, golf, or swimming.  You may go back to work when your health care provider says it is okay. Pacemaker care  You may be shown how to transfer data from your pacemaker through the phone to your health care provider.  Always let all health care providers know about your pacemaker before you have any medical procedures or tests.  Wear a medical ID bracelet or necklace stating that you have a pacemaker. Carry a pacemaker ID card with you at all times.  Your pacemaker battery will last for 5-15 years. Routine checks by your health care provider will let the health care provider know when the battery is starting to run down. The pacemaker will need to be replaced when the battery starts to run down.  Do not use amateur Chief of Staff. Other electrical devices are safe to use, including power tools, lawn mowers, and speakers. If you are unsure of whether something is safe to use,  ask your health care provider.  When using your cell phone, hold it to the ear opposite the pacemaker. Do not leave your cell phone in a pocket over the pacemaker.  Avoid places or objects that have a strong electric or magnetic field, including: ? Airport Actuarysecurity gates. When at the airport, let officials know that you have a pacemaker. ? Power plants. ? Large electrical generators. ? Radiofrequency transmission towers, such as cell phone and radio towers. General instructions  Weigh yourself every day. If you suddenly gain weight, fluid may be building up in your body.  Keep all follow-up visits as told by your health care provider. This is  important. Contact a health care provider if:  You gain weight suddenly.  Your legs or feet swell.  It feels like your heart is fluttering or skipping beats (heart palpitations).  You have chills or a fever.  You have more redness, swelling, or pain around your incisions.  You have more fluid or blood coming from your incisions.  Your incisions feel warm to the touch.  You have pus or a bad smell coming from your incisions. Get help right away if:  You have chest pain.  You have trouble breathing or are short of breath.  You become extremely tired.  You are light-headed or you faint. This information is not intended to replace advice given to you by your health care provider. Make sure you discuss any questions you have with your health care provider. Document Released: 10/02/2004 Document Revised: 12/26/2015 Document Reviewed: 12/26/2015 Elsevier Interactive Patient Education  2018 [employer]    Supplemental Discharge Instructions for  Pacemaker/Defibrillator Patients  ACTIVITY No heavy lifting or vigorous activity with your left/right arm for 6 to 8 weeks.  Do not raise your left/right arm above your head for one week.  Gradually raise your affected arm as drawn below.           __  NO DRIVING for     ; you may begin driving on     .  WOUND CARE - Keep the wound area clean and dry.  Do not get this area wet for one week. No showers for one week; you may shower on     . - The tape/steri-strips on your wound will fall off; do not pull them off.  No bandage is needed on the site.  DO  NOT apply any creams, oils, or ointments to the wound area. - If you notice any drainage or discharge from the wound, any swelling or bruising at the site, or you develop a fever > 101? F after you are discharged home, call the office at once.  SPECIAL INSTRUCTIONS - You are still able to use cellular telephones; use the ear opposite the side where you have your  pacemaker/defibrillator.  Avoid carrying your cellular phone near your device. - When traveling through airports, show security personnel your identification card to avoid being screened in the metal detectors.  Ask the security personnel to use the hand wand. - Avoid arc welding equipment, MRI testing (magnetic resonance imaging), TENS units (transcutaneous nerve stimulators).  Call the office for questions about other devices. - Avoid electrical appliances that are in poor condition or are not properly grounded. - Microwave ovens are safe to be near or to operate.  ADDITIONAL INFORMATION FOR DEFIBRILLATOR PATIENTS SHOULD YOUR DEVICE GO OFF: - If your device goes off ONCE and you feel fine afterward, notify the device clinic nurses. - If your  device goes off ONCE and you do not feel well afterward, call 911. - If your device goes off TWICE, call 911. - If your device goes off THREE TIMES IN ONE DAY, call 911.  DO NOT DRIVE YOURSELF OR A FAMILY MEMBER WITH A DEFIBRILLATOR TO THE HOSPITAL-CALL 911.

## 2018-11-07 NOTE — Progress Notes (Signed)
Electrophysiology Office Note   Date:  11/07/2018   ID:  Erika Rice, DOB [birth year], MRN [id number]  PCP:  Mateo Flow, MD  Cardiologist: Revankar Primary Electrophysiologist:  Journee Kohen Meredith Leeds, MD    No chief complaint on file.    History of Present Illness: Erika Rice is a 85 y.o. female who is being seen today for the evaluation of bradycardia at the request of [place]. Presenting today for electrophysiology evaluation.  She has a history significant for atrial fibrillation, COPD, type 2 diabetes, hypertension, and dementia.  Bradycardic and had a cardiac monitor placed.  Monitor shows episodes of bradycardia and thus she was referred.  She has been weak and fatigued for the past few months.  She wore a cardiac monitor that showed sinus rhythm with sinus arrest and pauses up to 4-1/2 seconds.  Most of her pauses were nocturnal, but she does come into clinic this morning with heart rates in the 30s complaining of weakness and fatigue.  Today, she denies symptoms of palpitations, chest pain, shortness of breath, orthopnea, PND, lower extremity edema, claudication, dizziness, presyncope, syncope, bleeding, or neurologic sequela. The patient is tolerating medications without difficulties.    Past Medical History:  Diagnosis Date  . Atrial fibrillation ([place])   . Bradycardia   . COPD (chronic obstructive pulmonary disease) ([place])   . Degenerative joint disease   . Dementia ([place])   . Diabetes mellitus type 2, noninsulin dependent ([place])   . Dyspnea   . GERD (gastroesophageal reflux disease)   . Hypertension   . Lymphopenia   . Right lower lobe pulmonary infiltrate   . Syncope   . UTI (urinary tract infection)   . Weakness    Past Surgical History:  Procedure Laterality Date  . CHOLECYSTECTOMY    . HYSTERECTOMY ABDOMINAL WITH SALPINGECTOMY    . TONSILLECTOMY       Current Outpatient Medications  Medication Sig Dispense Refill  . albuterol (PROVENTIL)  (2.5 MG/3ML) 0.083% nebulizer solution Inhale into the lungs as directed.    Marland Kitchen ALPRAZolam (XANAX) 0.25 MG tablet Take 0.25 mg by mouth See admin instructions. Twice daily Before a nap or bedtime if needed for sleep    . amLODipine (NORVASC) 5 MG tablet Take 5 mg by mouth daily.    . celecoxib (CELEBREX) 200 MG capsule Take 200 mg by mouth daily.    . Cholecalciferol (VITAMIN D3) 1000 units CAPS Take 1 capsule by mouth daily.     Marland Kitchen escitalopram (LEXAPRO) 20 MG tablet Take 20 mg by mouth daily.     . famotidine (PEPCID) 10 MG tablet Take 10 mg by mouth at bedtime.    . fentaNYL (DURAGESIC) 25 MCG/HR Place 1 patch onto the skin every other day.    . gabapentin (NEURONTIN) 800 MG tablet Take 800 mg by mouth 3 (three) times daily.    . meclizine (ANTIVERT) 12.5 MG tablet Take 12.5 mg by mouth every 6 (six) hours as needed for dizziness.     . memantine (NAMENDA) 10 MG tablet Take 10 mg by mouth 2 (two) times daily.    . metroNIDAZOLE (FLAGYL) 500 MG tablet Take 1 tablet (500 mg total) by mouth every 8 (eight) hours. 21 tablet 0  . oxyCODONE-acetaminophen (PERCOCET) 7.5-325 MG tablet Take 1 tablet by mouth every 8 (eight) hours as needed for moderate pain.     . pantoprazole (PROTONIX) 40 MG tablet Take 40 mg by mouth every morning.     Marland Kitchen  polyvinyl alcohol (LIQUIFILM TEARS) 1.4 % ophthalmic solution Place 1 drop into both eyes as needed for dry eyes. 15 mL 0  . risperiDONE (RISPERDAL) 0.25 MG tablet Take 0.5 mg by mouth at bedtime.     . vitamin B-12 (CYANOCOBALAMIN) 1000 MCG tablet Take 1,000 mcg by mouth daily.      No current facility-administered medications for this visit.     Allergies:   Ace inhibitors and Sulfamethoxazole   Social History:  The patient  reports that she has never smoked. She has never used smokeless tobacco. She reports that she does not drink alcohol or use drugs.   Family History:  The patient's family history includes Cancer in her brother; Diabetes in her mother;  Hypertension in her father and mother.    ROS:  Please see the history of present illness.   Otherwise, review of systems is positive for none.   All other systems are reviewed and negative.    PHYSICAL EXAM: VS:  BP 112/60   Pulse (!) 34   Ht 5\' 3"  (1.6 m)   Wt 166 lb (75.3 kg)   BMI 29.41 kg/m  , BMI Body mass index is 29.41 kg/m. GEN: Well nourished, well developed, in no acute distress  HEENT: normal  Neck: no JVD, carotid bruits, or masses Cardiac: RRR; no murmurs, rubs, or gallops,no edema  Respiratory:  clear to auscultation bilaterally, normal work of breathing GI: soft, nontender, nondistended, + BS MS: no deformity or atrophy  Skin: warm and dry Neuro:  Strength and sensation are intact Psych: euthymic mood, full affect  EKG:  EKG is ordered today. Personal review of the ekg ordered shows sinus rhythm, intermittent sinus arrest, rate 34, right bundle branch block, left anterior fascicular block  Recent Labs: 07/07/2018: B Natriuretic Peptide 281.5 07/08/2018: Magnesium 1.8 07/09/2018: ALT 11; BUN 14; Creatinine, Ser 0.86; Hemoglobin 11.5; Platelets 248; Potassium 3.6; Sodium 142    Lipid Panel  No results found for: CHOL, TRIG, HDL, CHOLHDL, VLDL, LDLCALC, LDLDIRECT   Wt Readings from Last 3 Encounters:  11/07/18 166 lb (75.3 kg)  09/14/18 166 lb (75.3 kg)  07/06/18 180 lb (81.6 kg)      Other studies Reviewed: Additional studies/ records that were reviewed today include: Cardiac monitor 10/10/2018 personally reviewed Review of the above records today demonstrates:  Baseline rhythm: Sinus Minimum heart rate: 23 BPM at 7:31 AM on 10/01/2018.  Average heart rate: 48 BPM.  Maximal heart rate 81 BPM. Atrial arrhythmia: Sinus rhythm rare brief atrial runs the longest was 8 beats Ventricular arrhythmia: None significant rare PVCs Conduction abnormality: Patient had multiple pauses.  87 pauses occurred the longest was 4.7 seconds.    ASSESSMENT AND PLAN:  1.   Sinus bradycardia with sinus arrest: Patient has significant symptoms of weakness and fatigue associated with this.  She also had pauses on her cardiac monitor.  I think that she would likely benefit from pacemaker implant.  Risks and benefits were discussed and include bleeding, tamponade, infection, pneumothorax.  The patient understands these risks and is agreed to the procedure.  Case discussed with referring cardiologist  Current medicines are reviewed at length with the patient today.   The patient does not have concerns regarding her medicines.  The following changes were made today:  none  Labs/ tests ordered today include:  Orders Placed This Encounter  Procedures  . CBC  . Basic metabolic panel  . EKG 12-Lead     Disposition:   FU with  Dessie Delcarlo 3 months  Signed, Shaney Deckman Jorja Loa, MD  11/07/2018 10:01 AM     [facility] [address] [place] [address] [phone] (office) [phone] (fax)

## 2018-11-10 ENCOUNTER — Ambulatory Visit (HOSPITAL_COMMUNITY): Payer: Medicare Other

## 2018-11-10 ENCOUNTER — Ambulatory Visit (HOSPITAL_COMMUNITY)
Admission: AD | Admit: 2018-11-10 | Discharge: 2018-11-15 | Disposition: A | Discharge status: Home / Self Care | Payer: Medicare Other | Attending: Cardiology | Admitting: Cardiology

## 2018-11-10 ENCOUNTER — Encounter (HOSPITAL_COMMUNITY)
Admission: AD | Disposition: A | Discharge status: Home / Self Care | Payer: Self-pay | Source: Home / Self Care | Attending: Cardiology

## 2018-11-10 ENCOUNTER — Other Ambulatory Visit: Payer: Self-pay

## 2018-11-10 DIAGNOSIS — I495 Sick sinus syndrome: Secondary | ICD-10-CM | POA: Diagnosis not present

## 2018-11-10 DIAGNOSIS — R001 Bradycardia, unspecified: Secondary | ICD-10-CM | POA: Diagnosis present

## 2018-11-10 DIAGNOSIS — R2681 Unsteadiness on feet: Secondary | ICD-10-CM | POA: Diagnosis not present

## 2018-11-10 DIAGNOSIS — Z791 Long term (current) use of non-steroidal anti-inflammatories (NSAID): Secondary | ICD-10-CM | POA: Insufficient documentation

## 2018-11-10 DIAGNOSIS — M1990 Unspecified osteoarthritis, unspecified site: Secondary | ICD-10-CM | POA: Insufficient documentation

## 2018-11-10 DIAGNOSIS — Z882 Allergy status to sulfonamides status: Secondary | ICD-10-CM | POA: Diagnosis not present

## 2018-11-10 DIAGNOSIS — Z79899 Other long term (current) drug therapy: Secondary | ICD-10-CM | POA: Insufficient documentation

## 2018-11-10 DIAGNOSIS — I480 Paroxysmal atrial fibrillation: Secondary | ICD-10-CM | POA: Insufficient documentation

## 2018-11-10 DIAGNOSIS — Z950 Presence of cardiac pacemaker: Secondary | ICD-10-CM | POA: Insufficient documentation

## 2018-11-10 DIAGNOSIS — J95811 Postprocedural pneumothorax: Secondary | ICD-10-CM | POA: Diagnosis not present

## 2018-11-10 DIAGNOSIS — R042 Hemoptysis: Secondary | ICD-10-CM

## 2018-11-10 DIAGNOSIS — M6281 Muscle weakness (generalized): Secondary | ICD-10-CM | POA: Insufficient documentation

## 2018-11-10 DIAGNOSIS — E119 Type 2 diabetes mellitus without complications: Secondary | ICD-10-CM | POA: Insufficient documentation

## 2018-11-10 DIAGNOSIS — I10 Essential (primary) hypertension: Secondary | ICD-10-CM | POA: Insufficient documentation

## 2018-11-10 DIAGNOSIS — J939 Pneumothorax, unspecified: Secondary | ICD-10-CM | POA: Diagnosis not present

## 2018-11-10 DIAGNOSIS — Z95818 Presence of other cardiac implants and grafts: Secondary | ICD-10-CM

## 2018-11-10 HISTORY — PX: PACEMAKER IMPLANT: EP1218

## 2018-11-10 LAB — BASIC METABOLIC PANEL
Anion gap: 9 (ref 5–15)
BUN: 17 mg/dL (ref 8–23)
CO2: 30 mmol/L (ref 22–32)
Calcium: 10.1 mg/dL (ref 8.9–10.3)
Chloride: 102 mmol/L (ref 98–111)
Creatinine, Ser: 1.06 mg/dL — ABNORMAL HIGH (ref 0.44–1.00)
GFR calc Af Amer: 53 mL/min — ABNORMAL LOW (ref >60)
GFR calc non Af Amer: 46 mL/min — ABNORMAL LOW (ref >60)
Glucose, Bld: 96 mg/dL (ref 70–99)
Potassium: 4.3 mmol/L (ref 3.5–5.1)
Sodium: 141 mmol/L (ref 135–145)

## 2018-11-10 LAB — SURGICAL PCR SCREEN
MRSA, PCR: NEGATIVE
Staphylococcus aureus: NEGATIVE

## 2018-11-10 LAB — GLUCOSE, CAPILLARY: Glucose-Capillary: 82 mg/dL (ref 70–99)

## 2018-11-10 SURGERY — PACEMAKER IMPLANT

## 2018-11-10 MED ORDER — SODIUM CHLORIDE 0.9 % IV SOLN
INTRAVENOUS | Status: AC
  Filled 2018-11-10: qty 2

## 2018-11-10 MED ORDER — SODIUM CHLORIDE 0.9 % IV SOLN: 80.0000 mg | INTRAVENOUS | Status: DC

## 2018-11-10 MED ORDER — MEMANTINE HCL 5 MG PO TABS
10.0000 mg | ORAL_TABLET | Freq: Two times a day (BID) | ORAL | Status: DC
  Administered 2018-11-10 – 2018-11-15 (×10): 10 mg via ORAL
  Filled 2018-11-10: qty 2
  Filled 2018-11-10 (×4): qty 1
  Filled 2018-11-10: qty 2
  Filled 2018-11-10 (×5): qty 1

## 2018-11-10 MED ORDER — SODIUM CHLORIDE 0.9% FLUSH: 10.0000 mL | INTRAVENOUS | Status: DC | PRN

## 2018-11-10 MED ORDER — CEFAZOLIN SODIUM-DEXTROSE 2-4 GM/100ML-% IV SOLN
2.0000 g | INTRAVENOUS | Status: AC
  Administered 2018-11-10: 2 g via INTRAVENOUS

## 2018-11-10 MED ORDER — SODIUM CHLORIDE 0.9 % IV SOLN
INTRAVENOUS | Status: DC
  Administered 2018-11-10: 12:00:00 via INTRAVENOUS

## 2018-11-10 MED ORDER — CEFAZOLIN SODIUM-DEXTROSE 2-4 GM/100ML-% IV SOLN
INTRAVENOUS | Status: AC
  Filled 2018-11-10: qty 100

## 2018-11-10 MED ORDER — ACETAMINOPHEN 500 MG PO TABS
500.0000 mg | ORAL_TABLET | Freq: Four times a day (QID) | ORAL | Status: DC | PRN
  Administered 2018-11-13: 500 mg via ORAL
  Filled 2018-11-10: qty 1

## 2018-11-10 MED ORDER — LIDOCAINE HCL (PF) 1 % IJ SOLN
INTRAMUSCULAR | Status: AC
  Filled 2018-11-10: qty 60

## 2018-11-10 MED ORDER — HEPARIN (PORCINE) IN NACL 1000-0.9 UT/500ML-% IV SOLN
INTRAVENOUS | Status: DC | PRN
  Administered 2018-11-10: 500 mL

## 2018-11-10 MED ORDER — IOHEXOL 350 MG/ML SOLN
INTRAVENOUS | Status: DC | PRN
  Administered 2018-11-10: 15 mL via INTRAVENOUS

## 2018-11-10 MED ORDER — CEFAZOLIN SODIUM-DEXTROSE 1-4 GM/50ML-% IV SOLN
1.0000 g | Freq: Three times a day (TID) | INTRAVENOUS | Status: AC
  Administered 2018-11-10 – 2018-11-11 (×2): 1 g via INTRAVENOUS
  Filled 2018-11-10 (×2): qty 50

## 2018-11-10 MED ORDER — MUPIROCIN 2 % EX OINT
TOPICAL_OINTMENT | CUTANEOUS | Status: AC
  Administered 2018-11-10: 11:00:00
  Filled 2018-11-10: qty 22

## 2018-11-10 MED ORDER — GABAPENTIN 400 MG PO CAPS
800.0000 mg | ORAL_CAPSULE | Freq: Three times a day (TID) | ORAL | Status: DC
  Administered 2018-11-10 – 2018-11-15 (×15): 800 mg via ORAL
  Filled 2018-11-10 (×16): qty 2

## 2018-11-10 MED ORDER — MECLIZINE HCL 25 MG PO TABS: 12.5000 mg | ORAL_TABLET | Freq: Four times a day (QID) | ORAL | Status: DC | PRN

## 2018-11-10 MED ORDER — CHLORHEXIDINE GLUCONATE 4 % EX LIQD
60.0000 mL | Freq: Once | CUTANEOUS | Status: DC
  Filled 2018-11-10: qty 60

## 2018-11-10 MED ORDER — ESCITALOPRAM OXALATE 10 MG PO TABS
20.0000 mg | ORAL_TABLET | Freq: Every day | ORAL | Status: DC
  Administered 2018-11-10 – 2018-11-15 (×6): 20 mg via ORAL
  Filled 2018-11-10 (×6): qty 2

## 2018-11-10 MED ORDER — RISPERIDONE 0.5 MG PO TABS
0.5000 mg | ORAL_TABLET | Freq: Every day | ORAL | Status: DC
  Administered 2018-11-10 – 2018-11-14 (×5): 0.5 mg via ORAL
  Filled 2018-11-10 (×7): qty 1

## 2018-11-10 MED ORDER — HEPARIN (PORCINE) IN NACL 1000-0.9 UT/500ML-% IV SOLN
INTRAVENOUS | Status: AC
  Filled 2018-11-10: qty 500

## 2018-11-10 MED ORDER — FAMOTIDINE 20 MG PO TABS
20.0000 mg | ORAL_TABLET | Freq: Every day | ORAL | Status: DC
  Administered 2018-11-10 – 2018-11-14 (×5): 20 mg via ORAL
  Filled 2018-11-10 (×5): qty 1

## 2018-11-10 MED ORDER — VITAMIN D 25 MCG (1000 UNIT) PO TABS
5000.0000 [IU] | ORAL_TABLET | Freq: Every day | ORAL | Status: DC
  Administered 2018-11-11 – 2018-11-14 (×4): 5000 [IU] via ORAL
  Filled 2018-11-10 (×6): qty 5

## 2018-11-10 MED ORDER — OXYCODONE-ACETAMINOPHEN 7.5-325 MG PO TABS
1.0000 | ORAL_TABLET | Freq: Three times a day (TID) | ORAL | Status: DC | PRN
  Administered 2018-11-10 – 2018-11-14 (×9): 1 via ORAL
  Filled 2018-11-10 (×11): qty 1

## 2018-11-10 MED ORDER — AMLODIPINE BESYLATE 5 MG PO TABS
5.0000 mg | ORAL_TABLET | Freq: Every day | ORAL | Status: DC
  Administered 2018-11-11 – 2018-11-15 (×5): 5 mg via ORAL
  Filled 2018-11-10 (×5): qty 1

## 2018-11-10 MED ORDER — VITAMIN B-12 1000 MCG PO TABS
1000.0000 ug | ORAL_TABLET | Freq: Every day | ORAL | Status: DC
  Administered 2018-11-11 – 2018-11-14 (×4): 1000 ug via ORAL
  Filled 2018-11-10 (×4): qty 1

## 2018-11-10 MED ORDER — POLYETHYL GLYCOL-PROPYL GLYCOL 0.4-0.3 % OP SOLN: 1.0000 [drp] | Freq: Three times a day (TID) | OPHTHALMIC | Status: DC | PRN

## 2018-11-10 MED ORDER — ONDANSETRON HCL 4 MG/2ML IJ SOLN: 4.0000 mg | Freq: Four times a day (QID) | INTRAMUSCULAR | Status: DC | PRN

## 2018-11-10 MED ORDER — PANTOPRAZOLE SODIUM 40 MG PO TBEC
40.0000 mg | DELAYED_RELEASE_TABLET | Freq: Every day | ORAL | Status: DC
  Administered 2018-11-11 – 2018-11-14 (×4): 40 mg via ORAL
  Filled 2018-11-10 (×4): qty 1

## 2018-11-10 MED ORDER — ALPRAZOLAM 0.25 MG PO TABS
0.2500 mg | ORAL_TABLET | Freq: Every day | ORAL | Status: DC | PRN
  Administered 2018-11-10 – 2018-11-14 (×3): 0.25 mg via ORAL
  Filled 2018-11-10 (×3): qty 1

## 2018-11-10 MED ORDER — SODIUM CHLORIDE 0.9% FLUSH
10.0000 mL | Freq: Two times a day (BID) | INTRAVENOUS | Status: DC
  Administered 2018-11-11 – 2018-11-14 (×7): 10 mL

## 2018-11-10 MED ORDER — FENTANYL 25 MCG/HR TD PT72
1.0000 | MEDICATED_PATCH | TRANSDERMAL | Status: DC
  Administered 2018-11-13: 1 via TRANSDERMAL
  Filled 2018-11-10: qty 1

## 2018-11-10 MED ORDER — LIDOCAINE HCL (PF) 1 % IJ SOLN
INTRAMUSCULAR | Status: DC | PRN
  Administered 2018-11-10: 45 mL

## 2018-11-10 MED ORDER — ALBUTEROL SULFATE (2.5 MG/3ML) 0.083% IN NEBU: 2.5000 mg | INHALATION_SOLUTION | Freq: Four times a day (QID) | RESPIRATORY_TRACT | Status: DC | PRN

## 2018-11-10 SURGICAL SUPPLY — 5 items
CABLE SURGICAL S-101-97-12 (CABLE) ×3 IMPLANT
KIT MICROPUNCTURE NIT STIFF (SHEATH) ×2 IMPLANT
PAD PRO RADIOLUCENT 2001M-C (PAD) ×3 IMPLANT
SHEATH CLASSIC 8F (SHEATH) ×4 IMPLANT
TRAY PACEMAKER INSERTION (PACKS) ×3 IMPLANT

## 2018-11-10 NOTE — Consult Note (Signed)
NAME:  Erika Rice, MRN:  [id number], DOB:  Sep 19, 1924, LOS: 0 ADMISSION DATE:  11/10/2018, CONSULTATION DATE:  11/10/2018 REFERRING MD: Elberta Fortis   CHIEF COMPLAINT:  Possible pneumothorax  Brief History   85 yo F who was in cath lab for pacemaker implantation, aborted suspected PTX. PCCM consulted.   History of present illness   85 yo F PMH includes bradycardia, COPD, a fib, DM2, HTN, dementia. She was recently referred to cardiology for her bradycardia and cardiac monitor revealed sinus rhythm with sinus arrest, and pauses up to 4.5 seconds. She was scheduled for pacemaker implantation 8/14. In cath lab prior to pacemaker implantation, patient exhibited some hemoptysis. A CXR was obtained and there was concern for small PTX. PCCM was consulted.  The patient is being admitted to CVICU for further observation and management.   Past Medical History  Bradycardia Abnormal ECG with sinus arrest, pause up to 4.5 seconds HTN Dementia Atrial Fibrillation  COPD GERD DM II  Significant Hospital Events   8/14 admitted after aborted pacemaker implantation.   Consults:  PCCM  Procedures:  8/14> pacemaker implant, aborted in cath lab for suspected pneumothorax   Significant Diagnostic Tests:  8/14 CXR Elevated R hemidiaphragm. No obvious pneumothorax  8/14 repeat CXR >>>   Micro Data:  8/11 SARS CoV2> negative   Antimicrobials:    Interim history/subjective:  Admitted to CVICU  Is hypertensive at present Remains bradycardic SpO2 100% on NRB  Objective   Blood pressure (!) 154/51, pulse (!) 42, temperature 98 F (36.7 C), temperature source Skin, resp. rate 15, height 5\' 2"  (1.575 m), weight 75.3 kg, SpO2 100 %.       No intake or output data in the 24 hours ending 11/10/18 1459 Filed Weights   11/10/18 1041  Weight: 75.3 kg    Examination: General: Chronically ill appearing, frail, older adult female, supine in bed NAD HENT: NCAT. Thinning hair. R eye pupillary defect.  Anicteric sclera. Pink mmm. Trachea midline. NRB in place Lungs: CTA bilaterally. Symmetrical respirations. Symmetrical chest expansion Cardiovascular: bradycardic rate. s1s2 no rgm. 1+ radial pulses. Capillary refill < 3 sec BUE BLE  Abdomen: Soft, ndnt. normoactive Extremities: Symmetrical bulk and tone. No obvious large joint deformity. No cyanosis or clubbing  Neuro: Drowsy but awake. Oriented x3. Following commands.  GU: Pure wick collecting yellow urine Skin: pale, clean, dry, warm without rash  Resolved Hospital Problem list   This is a 85 yo F who was undergoing elective PPM placement with concern for possible iatrogenic pneumothorax. POCUS lung some lung slide and pt status-alert, no respiratory distress, oxygenating well- all reassuring for clinically insignificant and/or self-resolution.   Plan Repeat CXR, upright  Continue oxygen therapy for nitrogen washout Continue tele monitor, monitor for signs of hemodynamic compromise If no PTX on follow up CXR, anticipate we can wean O2 for SpO2 goal 88-92 in setting of underlying COPD, otherwise follow clinically and consider small bore chest tube/needle thoracostomy.   Best practice:  Diet: NPO for now  Pain/Anxiety/Delirium protocol (if indicated): per primary  VAP protocol (if indicated): per primary  DVT prophylaxis: per primary  GI prophylaxis: not indicated Glucose control: per primary  Mobility: BR Code Status: FULL CODE Family Communication: updated by primary  Disposition: ICU    Labs   CBC: Recent Labs  Lab 11/07/18 1027  WBC 4.6  HGB 13.4  HCT 41.5  MCV 95  PLT 209    Basic Metabolic Panel: Recent Labs  Lab 11/07/18 1027 11/10/18  1100  NA 142 141  K 5.2 4.3  CL 100 102  CO2 28 30  GLUCOSE 117* 96  BUN 15 17  CREATININE 1.07* 1.06*  CALCIUM 10.3 10.1   GFR: Estimated Creatinine Clearance: 32.2 mL/min (A) (by C-G formula based on SCr of 1.06 mg/dL (H)). Recent Labs  Lab 11/07/18 1027  WBC 4.6     Liver Function Tests: No results for input(s): AST, ALT, ALKPHOS, BILITOT, PROT, ALBUMIN in the last 168 hours. No results for input(s): LIPASE, AMYLASE in the last 168 hours. No results for input(s): AMMONIA in the last 168 hours.  ABG No results found for: PHART, PCO2ART, PO2ART, HCO3, TCO2, ACIDBASEDEF, O2SAT   Coagulation Profile: No results for input(s): INR, PROTIME in the last 168 hours.  Cardiac Enzymes: No results for input(s): CKTOTAL, CKMB, CKMBINDEX, TROPONINI in the last 168 hours.  HbA1C: No results found for: HGBA1C  CBG: Recent Labs  Lab 11/10/18 1044  GLUCAP 82    Review of Systems: negative except as noted in HPI.     Past Medical History  She,  has a past medical history of Atrial fibrillation ([place]), Bradycardia, COPD (chronic obstructive pulmonary disease) ([place]), Degenerative joint disease, Dementia ([place]), Diabetes mellitus type 2, noninsulin dependent ([place]), Dyspnea, GERD (gastroesophageal reflux disease), Hypertension, Lymphopenia, Right lower lobe pulmonary infiltrate, Syncope, UTI (urinary tract infection), and Weakness.   Surgical History    Past Surgical History:  Procedure Laterality Date  . CHOLECYSTECTOMY    . HYSTERECTOMY ABDOMINAL WITH SALPINGECTOMY    . TONSILLECTOMY       Social History   reports that she has never smoked. She has never used smokeless tobacco. She reports that she does not drink alcohol or use drugs.   Family History   Her family history includes Cancer in her brother; Diabetes in her mother; Hypertension in her father and mother.   Allergies Allergies  Allergen Reactions  . Ace Inhibitors Other (See Comments)    Does not recall   . Sulfamethoxazole Rash and Other (See Comments)    Does not recall      Home Medications  Prior to Admission medications   Medication Sig Start Date End Date Taking? Authorizing Provider  acetaminophen (TYLENOL) 500 MG tablet Take 500 mg by mouth every 6 (six) hours as needed  (pain.).   Yes [provider]  albuterol (PROVENTIL) (2.5 MG/3ML) 0.083% nebulizer solution Inhale 2.5 mg into the lungs every 6 (six) hours as needed for wheezing or shortness of breath.  07/25/18  Yes [provider]  amLODipine (NORVASC) 5 MG tablet Take 5 mg by mouth daily. 09/05/18  Yes [provider]  celecoxib (CELEBREX) 200 MG capsule Take 200 mg by mouth daily at 12 noon.    Yes [provider]  Cholecalciferol (VITAMIN D-3) 125 MCG (5000 UT) TABS Take 5,000 Units by mouth daily at 12 noon.   Yes [provider]  escitalopram (LEXAPRO) 20 MG tablet Take 20 mg by mouth daily.  10/04/16  Yes [provider]  famotidine (PEPCID) 20 MG tablet Take 20 mg by mouth at bedtime.   Yes [provider]  fentaNYL (DURAGESIC) 25 MCG/HR Place 1 patch onto the skin every 3 (three) days.  06/16/18  Yes [provider]  gabapentin (NEURONTIN) 800 MG tablet Take 800 mg by mouth 3 (three) times daily. 05/29/18  Yes [provider]  meclizine (ANTIVERT) 12.5 MG tablet Take 12.5 mg by mouth every 6 (six) hours as needed  for dizziness.  06/08/18  Yes [provider]  memantine (NAMENDA) 10 MG tablet Take 10 mg by mouth 2 (two) times daily. 05/30/18  Yes [provider]  oxyCODONE-acetaminophen (PERCOCET) 7.5-325 MG tablet Take 1 tablet by mouth every 8 (eight) hours as needed for moderate pain.  12/24/16  Yes [provider]  pantoprazole (PROTONIX) 40 MG tablet Take 40 mg by mouth daily before breakfast.  11/15/16  Yes [provider]  Polyethyl Glycol-Propyl Glycol (LUBRICANT EYE DROPS) 0.4-0.3 % SOLN Place 1 drop into both eyes 3 (three) times daily as needed (dry/irritated eyes.).   Yes [provider]  risperiDONE (RISPERDAL) 0.25 MG tablet Take 0.5 mg by mouth at bedtime.  10/04/16  Yes [provider]  vitamin B-12 (CYANOCOBALAMIN) 1000 MCG tablet Take 1,000 mcg by mouth daily at 12  noon.    Yes [provider]  ALPRAZolam (XANAX) 0.25 MG tablet Take 0.25 mg by mouth daily as needed for anxiety.  10/14/16   [provider]  metroNIDAZOLE (FLAGYL) 500 MG tablet Take 1 tablet (500 mg total) by mouth every 8 (eight) hours. Patient not taking: Reported on 11/08/2018 07/09/18   Alwyn Ren, MD  polyvinyl alcohol (LIQUIFILM TEARS) 1.4 % ophthalmic solution Place 1 drop into both eyes as needed for dry eyes. Patient not taking: Reported on 11/08/2018 07/09/18   Alwyn Ren, MD     Karin Lieu, MSN, AGACNP  Pager [phone] or if no answer [phone] [facility]

## 2018-11-10 NOTE — H&P (Signed)
Erika Rice has presented today for surgery, with the diagnosis of sinus arrest.  The various methods of treatment have been discussed with the patient and family. After consideration of risks, benefits and other options for treatment, the patient has consented to  Procedure(s): Pacemaker implant as a surgical intervention .  Risks include but not limited to bleeding, tamponade, infection, pneumothorax, among others. The patient's history has been reviewed, patient examined, no change in status, stable for surgery.  I have reviewed the patient's chart and labs.  Questions were answered to the patient's satisfaction.    Sharona Rovner Curt Bears, MD 11/10/2018 10:38 AM

## 2018-11-11 ENCOUNTER — Ambulatory Visit (HOSPITAL_COMMUNITY): Payer: Medicare Other

## 2018-11-11 ENCOUNTER — Encounter (HOSPITAL_COMMUNITY): Payer: Self-pay

## 2018-11-11 DIAGNOSIS — R042 Hemoptysis: Secondary | ICD-10-CM | POA: Diagnosis not present

## 2018-11-11 DIAGNOSIS — M6281 Muscle weakness (generalized): Secondary | ICD-10-CM | POA: Diagnosis not present

## 2018-11-11 DIAGNOSIS — Z79899 Other long term (current) drug therapy: Secondary | ICD-10-CM | POA: Diagnosis not present

## 2018-11-11 DIAGNOSIS — I480 Paroxysmal atrial fibrillation: Secondary | ICD-10-CM | POA: Diagnosis not present

## 2018-11-11 DIAGNOSIS — J939 Pneumothorax, unspecified: Secondary | ICD-10-CM

## 2018-11-11 DIAGNOSIS — Z882 Allergy status to sulfonamides status: Secondary | ICD-10-CM | POA: Diagnosis not present

## 2018-11-11 DIAGNOSIS — R2681 Unsteadiness on feet: Secondary | ICD-10-CM | POA: Diagnosis not present

## 2018-11-11 DIAGNOSIS — Z791 Long term (current) use of non-steroidal anti-inflammatories (NSAID): Secondary | ICD-10-CM | POA: Diagnosis not present

## 2018-11-11 DIAGNOSIS — I10 Essential (primary) hypertension: Secondary | ICD-10-CM | POA: Diagnosis not present

## 2018-11-11 DIAGNOSIS — Z950 Presence of cardiac pacemaker: Secondary | ICD-10-CM | POA: Diagnosis not present

## 2018-11-11 DIAGNOSIS — E119 Type 2 diabetes mellitus without complications: Secondary | ICD-10-CM | POA: Diagnosis not present

## 2018-11-11 DIAGNOSIS — I495 Sick sinus syndrome: Secondary | ICD-10-CM | POA: Diagnosis not present

## 2018-11-11 DIAGNOSIS — M1990 Unspecified osteoarthritis, unspecified site: Secondary | ICD-10-CM | POA: Diagnosis not present

## 2018-11-11 MED ORDER — CHLORHEXIDINE GLUCONATE CLOTH 2 % EX PADS
6.0000 | MEDICATED_PAD | Freq: Every day | CUTANEOUS | Status: DC
  Administered 2018-11-11 – 2018-11-14 (×4): 6 via TOPICAL

## 2018-11-11 NOTE — Progress Notes (Signed)
Progress Note  Patient Name: Erika Rice Date of Encounter: 11/11/2018  Primary Cardiologist: No primary care provider on file.   Subjective   Sleeping comfortably but awakens to verbal stimuli. C/o soreness in back and small amount of hemoptysis.  Inpatient Medications    Scheduled Meds: . amLODipine  5 mg Oral Daily  . Chlorhexidine Gluconate Cloth  6 each Topical Daily  . cholecalciferol  5,000 Units Oral Q1200  . escitalopram  20 mg Oral Daily  . famotidine  20 mg Oral QHS  . [START ON 11/13/2018] fentaNYL  1 patch Transdermal Q72H  . gabapentin  800 mg Oral TID  . memantine  10 mg Oral BID  . pantoprazole  40 mg Oral QAC breakfast  . risperiDONE  0.5 mg Oral QHS  . sodium chloride flush  10-40 mL Intracatheter Q12H  . vitamin B-12  1,000 mcg Oral Q1200   Continuous Infusions:  PRN Meds: acetaminophen, albuterol, ALPRAZolam, meclizine, ondansetron (ZOFRAN) IV, oxyCODONE-acetaminophen, sodium chloride flush   Vital Signs    Vitals:   11/11/18 0700 11/11/18 0800 11/11/18 0829 11/11/18 0900  BP: (!) 173/69 (!) 153/76  132/61  Pulse:      Resp: 16 16  18   Temp:   98.2 F (36.8 C)   TempSrc:   Oral   SpO2: 100% 100%  100%  Weight:      Height:        Intake/Output Summary (Last 24 hours) at 11/11/2018 1055 Last data filed at 11/11/2018 0800 Gross per 24 hour  Intake 705.4 ml  Output 2500 ml  Net -1794.6 ml   Filed Weights   11/10/18 1041  Weight: 75.3 kg    Telemetry    Sinus bradycardia, marked - Personally Reviewed  ECG    Sinus bradycardia - Personally Reviewed  Physical Exam   GEN: frail appearing but no acute distress.   Neck: No JVD Cardiac: RRR, no murmurs, rubs, or gallops.  Respiratory: Clear to auscultation bilaterally. GI: Soft, nontender, non-distended  MS: No edema; No deformity. Neuro:  Nonfocal  Psych: Normal affect   Labs    Chemistry Recent Labs  Lab 11/07/18 1027 11/10/18 1100  NA 142 141  K 5.2 4.3  CL 100 102   CO2 28 30  GLUCOSE 117* 96  BUN 15 17  CREATININE 1.07* 1.06*  CALCIUM 10.3 10.1  GFRNONAA 45* 46*  GFRAA 52* 53*  ANIONGAP  --  9     Hematology Recent Labs  Lab 11/07/18 1027  WBC 4.6  RBC 4.35  HGB 13.4  HCT 41.5  MCV 95  MCH 30.8  MCHC 32.3  RDW 11.6*  PLT 209    Cardiac EnzymesNo results for input(s): TROPONINI in the last 168 hours. No results for input(s): TROPIPOC in the last 168 hours.   BNPNo results for input(s): BNP, PROBNP in the last 168 hours.   DDimer No results for input(s): DDIMER in the last 168 hours.   Radiology    Dg Chest 1 View  Result Date: 11/10/2018 CLINICAL DATA:  85 year old with hemoptysis. EXAM: CHEST  1 VIEW COMPARISON:  11/10/2018 and chest CT 08/27/2018 FINDINGS: Stable elevation of the right hemidiaphragm. Heart and mediastinum are stable. Few densities at the left costophrenic angle are probably chronic but could represent atelectasis. No significant airspace disease or consolidation. Negative for a pneumothorax. IMPRESSION: 1. No acute cardiopulmonary disease. 2. Chronic elevation of the right hemidiaphragm. Electronically Signed   By: 08/29/2018.D.  On: 11/10/2018 15:31   Dg Chest Port 1 View  Result Date: 11/11/2018 CLINICAL DATA:  Unsuccessful implantation of pacemaker. Evaluate for pneumothorax. EXAM: PORTABLE CHEST 1 VIEW COMPARISON:  11/10/2018 FINDINGS: Negative for a left pneumothorax. Chronic elevation of the right hemidiaphragm. Heart and mediastinum are stable. Chronic deformity and remodeling involving both shoulders. Atherosclerotic calcifications at the aortic arch. IMPRESSION: 1. Negative for a pneumothorax. 2. Stable chest radiograph findings. Chronic elevation of the right hemidiaphragm. Electronically Signed   By: Markus Daft M.D.   On: 11/11/2018 10:26   Dg Chest Port 1 View  Result Date: 11/10/2018 CLINICAL DATA:  Hemoptysis. EXAM: PORTABLE CHEST 1 VIEW COMPARISON:  Radiographs of Aug 27, 2018. FINDINGS: Stable  cardiomediastinal silhouette. No pneumothorax is noted. Stable elevated right hemidiaphragm is noted. No significant pulmonary disease is noted. No significant pleural effusion is noted. Degenerative changes are seen involving both glenohumeral joints. IMPRESSION: No active disease. Electronically Signed   By: Marijo Conception M.D.   On: 11/10/2018 13:13    Cardiac Studies   none  Patient Profile     85 y.o. female admitted with symptomatic sinus node dysfunction s/p attempted PPM complicated by hemoptysis resulting in cancellation of the procedure  Assessment & Plan    1. Profound sinus node dysfunction - her resting HR is in the high 20's and when she awakens goes up to about 40/min. She appears to be tolerating this reasonably well. I would anticipate placing a PPM on Monday, sooner if she were to become unstable. 2. Hemopystis - this is mild. Interestingly she does not have an obvious PTX on her multiple CXR's.  3. PAF - she is currently maintaining NSR.   Gregg Taylor,M.D.  For questions or updates, please contact [place] Please consult [email or web address] for contact info under Cardiology/STEMI.      Signed, Cristopher Peru, MD  11/11/2018, 10:55 AM  Patient ID: Erika Rice, female   DOB: January 14, 1925, 85 y.o.   MRN: [id number]

## 2018-11-11 NOTE — Progress Notes (Signed)
PCCM:  Repeat CXR with no PTX.  Please reconsult if needed PCCM will be available.   Garner Nash, DO [place] [facility] 11/11/2018 11:36 AM

## 2018-11-12 DIAGNOSIS — Z882 Allergy status to sulfonamides status: Secondary | ICD-10-CM | POA: Diagnosis not present

## 2018-11-12 DIAGNOSIS — E119 Type 2 diabetes mellitus without complications: Secondary | ICD-10-CM | POA: Diagnosis not present

## 2018-11-12 DIAGNOSIS — Z791 Long term (current) use of non-steroidal anti-inflammatories (NSAID): Secondary | ICD-10-CM | POA: Diagnosis not present

## 2018-11-12 DIAGNOSIS — M1990 Unspecified osteoarthritis, unspecified site: Secondary | ICD-10-CM | POA: Diagnosis not present

## 2018-11-12 DIAGNOSIS — Z79899 Other long term (current) drug therapy: Secondary | ICD-10-CM | POA: Diagnosis not present

## 2018-11-12 DIAGNOSIS — I480 Paroxysmal atrial fibrillation: Secondary | ICD-10-CM | POA: Diagnosis not present

## 2018-11-12 DIAGNOSIS — I10 Essential (primary) hypertension: Secondary | ICD-10-CM | POA: Diagnosis not present

## 2018-11-12 DIAGNOSIS — M6281 Muscle weakness (generalized): Secondary | ICD-10-CM | POA: Diagnosis not present

## 2018-11-12 DIAGNOSIS — Z950 Presence of cardiac pacemaker: Secondary | ICD-10-CM | POA: Diagnosis not present

## 2018-11-12 DIAGNOSIS — I495 Sick sinus syndrome: Secondary | ICD-10-CM | POA: Diagnosis not present

## 2018-11-12 DIAGNOSIS — R2681 Unsteadiness on feet: Secondary | ICD-10-CM | POA: Diagnosis not present

## 2018-11-12 DIAGNOSIS — J939 Pneumothorax, unspecified: Secondary | ICD-10-CM | POA: Diagnosis not present

## 2018-11-12 MED ORDER — SODIUM CHLORIDE 0.9 % IV SOLN
80.0000 mg | INTRAVENOUS | Status: AC
  Administered 2018-11-13: 80 mg

## 2018-11-12 MED ORDER — CEFAZOLIN SODIUM-DEXTROSE 2-4 GM/100ML-% IV SOLN
2.0000 g | INTRAVENOUS | Status: AC
  Administered 2018-11-13: 2 g via INTRAVENOUS

## 2018-11-12 MED ORDER — SODIUM CHLORIDE 0.9 % IV SOLN
INTRAVENOUS | Status: DC
  Administered 2018-11-13: 06:00:00 via INTRAVENOUS

## 2018-11-12 NOTE — H&P (View-Only) (Signed)
Progress Note  Patient Name: Erika Rice Date of Encounter: 11/12/2018  Primary Cardiologist: No primary care provider on file.   Subjective   Sleeping comfortably  Inpatient Medications    Scheduled Meds: . amLODipine  5 mg Oral Daily  . Chlorhexidine Gluconate Cloth  6 each Topical Daily  . cholecalciferol  5,000 Units Oral Q1200  . escitalopram  20 mg Oral Daily  . famotidine  20 mg Oral QHS  . [START ON 11/13/2018] fentaNYL  1 patch Transdermal Q72H  . gabapentin  800 mg Oral TID  . memantine  10 mg Oral BID  . pantoprazole  40 mg Oral QAC breakfast  . risperiDONE  0.5 mg Oral QHS  . sodium chloride flush  10-40 mL Intracatheter Q12H  . vitamin B-12  1,000 mcg Oral Q1200   Continuous Infusions:  PRN Meds: acetaminophen, albuterol, ALPRAZolam, meclizine, ondansetron (ZOFRAN) IV, oxyCODONE-acetaminophen, sodium chloride flush   Vital Signs    Vitals:   11/12/18 0500 11/12/18 0600 11/12/18 0700 11/12/18 0800  BP: 120/68 128/74 (!) 110/96 133/63  Pulse:      Resp: 10 18 (!) 9 16  Temp:    98 F (36.7 C)  TempSrc:    Oral  SpO2: 100% 100% 100% 100%  Weight:      Height:        Intake/Output Summary (Last 24 hours) at 11/12/2018 1041 Last data filed at 11/12/2018 0800 Gross per 24 hour  Intake 720 ml  Output 2400 ml  Net -1680 ml   Filed Weights   11/10/18 1041  Weight: 75.3 kg    Telemetry    Sinus bradycardia - Personally Reviewed  ECG    none - Personally Reviewed  Physical Exam   GEN: frail, elderly appearing no acute distress.   Neck: 6 cm JVD Cardiac: Reg brady, no murmurs, rubs, or gallops.  Respiratory: Clear to auscultation bilaterally. GI: Soft, nontender, non-distended  MS: No edema; No deformity. Neuro:  Nonfocal  Psych: Normal affect   Labs    Chemistry Recent Labs  Lab 11/07/18 1027 11/10/18 1100  NA 142 141  K 5.2 4.3  CL 100 102  CO2 28 30  GLUCOSE 117* 96  BUN 15 17  CREATININE 1.07* 1.06*  CALCIUM 10.3 10.1   GFRNONAA 45* 46*  GFRAA 52* 53*  ANIONGAP  --  9     Hematology Recent Labs  Lab 11/07/18 1027  WBC 4.6  RBC 4.35  HGB 13.4  HCT 41.5  MCV 95  MCH 30.8  MCHC 32.3  RDW 11.6*  PLT 209    Cardiac EnzymesNo results for input(s): TROPONINI in the last 168 hours. No results for input(s): TROPIPOC in the last 168 hours.   BNPNo results for input(s): BNP, PROBNP in the last 168 hours.   DDimer No results for input(s): DDIMER in the last 168 hours.   Radiology    Dg Chest 1 View  Result Date: 11/10/2018 CLINICAL DATA:  85 year old with hemoptysis. EXAM: CHEST  1 VIEW COMPARISON:  11/10/2018 and chest CT 08/27/2018 FINDINGS: Stable elevation of the right hemidiaphragm. Heart and mediastinum are stable. Few densities at the left costophrenic angle are probably chronic but could represent atelectasis. No significant airspace disease or consolidation. Negative for a pneumothorax. IMPRESSION: 1. No acute cardiopulmonary disease. 2. Chronic elevation of the right hemidiaphragm. Electronically Signed   By: Markus Daft M.D.   On: 11/10/2018 15:31   Dg Chest Port 1 View  Result Date: 11/11/2018  CLINICAL DATA:  Unsuccessful implantation of pacemaker. Evaluate for pneumothorax. EXAM: PORTABLE CHEST 1 VIEW COMPARISON:  11/10/2018 FINDINGS: Negative for a left pneumothorax. Chronic elevation of the right hemidiaphragm. Heart and mediastinum are stable. Chronic deformity and remodeling involving both shoulders. Atherosclerotic calcifications at the aortic arch. IMPRESSION: 1. Negative for a pneumothorax. 2. Stable chest radiograph findings. Chronic elevation of the right hemidiaphragm. Electronically Signed   By: Adam  Henn M.D.   On: 11/11/2018 10:26   Dg Chest Port 1 View  Result Date: 11/10/2018 CLINICAL DATA:  Hemoptysis. EXAM: PORTABLE CHEST 1 VIEW COMPARISON:  Radiographs of Aug 27, 2018. FINDINGS: Stable cardiomediastinal silhouette. No pneumothorax is noted. Stable elevated right  hemidiaphragm is noted. No significant pulmonary disease is noted. No significant pleural effusion is noted. Degenerative changes are seen involving both glenohumeral joints. IMPRESSION: No active disease. Electronically Signed   By: James  Green Jr M.D.   On: 11/10/2018 13:13    Cardiac Studies   none  Patient Profile     85 y.o. female admitted for evaluation of sinus node dysfunction, with initial PM attempt complicated by PTX/hemoptysis  Assessment & Plan    1. Profound sinus node dysfunction - She is a little better today. She will undergo PPM insertion tomorrow on right side. 2. PTX - CXR shows resolution. We will follow.   For questions or updates, please contact [facility] Please consult [email or web address] for contact info under Cardiology/STEMI.      Signed,  , MD  11/12/2018, 10:41 AM  Patient ID: Erika Rice, female   DOB: 07/17/1924, 85 y.o.   MRN: [id number]  

## 2018-11-12 NOTE — Progress Notes (Signed)
Progress Note  Patient Name: Erika Rice Date of Encounter: 11/12/2018  Primary Cardiologist: No primary care provider on file.   Subjective   Sleeping comfortably  Inpatient Medications    Scheduled Meds: . amLODipine  5 mg Oral Daily  . Chlorhexidine Gluconate Cloth  6 each Topical Daily  . cholecalciferol  5,000 Units Oral Q1200  . escitalopram  20 mg Oral Daily  . famotidine  20 mg Oral QHS  . [START ON 11/13/2018] fentaNYL  1 patch Transdermal Q72H  . gabapentin  800 mg Oral TID  . memantine  10 mg Oral BID  . pantoprazole  40 mg Oral QAC breakfast  . risperiDONE  0.5 mg Oral QHS  . sodium chloride flush  10-40 mL Intracatheter Q12H  . vitamin B-12  1,000 mcg Oral Q1200   Continuous Infusions:  PRN Meds: acetaminophen, albuterol, ALPRAZolam, meclizine, ondansetron (ZOFRAN) IV, oxyCODONE-acetaminophen, sodium chloride flush   Vital Signs    Vitals:   11/12/18 0500 11/12/18 0600 11/12/18 0700 11/12/18 0800  BP: 120/68 128/74 (!) 110/96 133/63  Pulse:      Resp: 10 18 (!) 9 16  Temp:    98 F (36.7 C)  TempSrc:    Oral  SpO2: 100% 100% 100% 100%  Weight:      Height:        Intake/Output Summary (Last 24 hours) at 11/12/2018 1041 Last data filed at 11/12/2018 0800 Gross per 24 hour  Intake 720 ml  Output 2400 ml  Net -1680 ml   Filed Weights   11/10/18 1041  Weight: 75.3 kg    Telemetry    Sinus bradycardia - Personally Reviewed  ECG    none - Personally Reviewed  Physical Exam   GEN: frail, elderly appearing no acute distress.   Neck: 6 cm JVD Cardiac: Reg brady, no murmurs, rubs, or gallops.  Respiratory: Clear to auscultation bilaterally. GI: Soft, nontender, non-distended  MS: No edema; No deformity. Neuro:  Nonfocal  Psych: Normal affect   Labs    Chemistry Recent Labs  Lab 11/07/18 1027 11/10/18 1100  NA 142 141  K 5.2 4.3  CL 100 102  CO2 28 30  GLUCOSE 117* 96  BUN 15 17  CREATININE 1.07* 1.06*  CALCIUM 10.3 10.1   GFRNONAA 45* 46*  GFRAA 52* 53*  ANIONGAP  --  9     Hematology Recent Labs  Lab 11/07/18 1027  WBC 4.6  RBC 4.35  HGB 13.4  HCT 41.5  MCV 95  MCH 30.8  MCHC 32.3  RDW 11.6*  PLT 209    Cardiac EnzymesNo results for input(s): TROPONINI in the last 168 hours. No results for input(s): TROPIPOC in the last 168 hours.   BNPNo results for input(s): BNP, PROBNP in the last 168 hours.   DDimer No results for input(s): DDIMER in the last 168 hours.   Radiology    Dg Chest 1 View  Result Date: 11/10/2018 CLINICAL DATA:  85 year old with hemoptysis. EXAM: CHEST  1 VIEW COMPARISON:  11/10/2018 and chest CT 08/27/2018 FINDINGS: Stable elevation of the right hemidiaphragm. Heart and mediastinum are stable. Few densities at the left costophrenic angle are probably chronic but could represent atelectasis. No significant airspace disease or consolidation. Negative for a pneumothorax. IMPRESSION: 1. No acute cardiopulmonary disease. 2. Chronic elevation of the right hemidiaphragm. Electronically Signed   By: Markus Daft M.D.   On: 11/10/2018 15:31   Dg Chest Port 1 View  Result Date: 11/11/2018  CLINICAL DATA:  Unsuccessful implantation of pacemaker. Evaluate for pneumothorax. EXAM: PORTABLE CHEST 1 VIEW COMPARISON:  11/10/2018 FINDINGS: Negative for a left pneumothorax. Chronic elevation of the right hemidiaphragm. Heart and mediastinum are stable. Chronic deformity and remodeling involving both shoulders. Atherosclerotic calcifications at the aortic arch. IMPRESSION: 1. Negative for a pneumothorax. 2. Stable chest radiograph findings. Chronic elevation of the right hemidiaphragm. Electronically Signed   By: Richarda Overlie M.D.   On: 11/11/2018 10:26   Dg Chest Port 1 View  Result Date: 11/10/2018 CLINICAL DATA:  Hemoptysis. EXAM: PORTABLE CHEST 1 VIEW COMPARISON:  Radiographs of Aug 27, 2018. FINDINGS: Stable cardiomediastinal silhouette. No pneumothorax is noted. Stable elevated right  hemidiaphragm is noted. No significant pulmonary disease is noted. No significant pleural effusion is noted. Degenerative changes are seen involving both glenohumeral joints. IMPRESSION: No active disease. Electronically Signed   By: Lupita Raider M.D.   On: 11/10/2018 13:13    Cardiac Studies   none  Patient Profile     85 y.o. female admitted for evaluation of sinus node dysfunction, with initial PM attempt complicated by PTX/hemoptysis  Assessment & Plan    1. Profound sinus node dysfunction - She is a little better today. She will undergo PPM insertion tomorrow on right side. 2. PTX - CXR shows resolution. We will follow.   For questions or updates, please contact [facility] Please consult [email or web address] for contact info under Cardiology/STEMI.      Signed, [name], MD  11/12/2018, 10:41 AM  Patient ID: Erika Rice, female   DOB: January 11, 1925, 85 y.o.   MRN: [id number]

## 2018-11-13 ENCOUNTER — Encounter (HOSPITAL_COMMUNITY): Payer: Self-pay | Admitting: Cardiology

## 2018-11-13 ENCOUNTER — Ambulatory Visit (HOSPITAL_COMMUNITY)
Admission: AD | Disposition: A | Discharge status: Home / Self Care | Payer: Self-pay | Source: Home / Self Care | Attending: Cardiology

## 2018-11-13 DIAGNOSIS — I480 Paroxysmal atrial fibrillation: Secondary | ICD-10-CM | POA: Diagnosis not present

## 2018-11-13 DIAGNOSIS — R2681 Unsteadiness on feet: Secondary | ICD-10-CM | POA: Diagnosis not present

## 2018-11-13 DIAGNOSIS — Z950 Presence of cardiac pacemaker: Secondary | ICD-10-CM | POA: Diagnosis not present

## 2018-11-13 DIAGNOSIS — M6281 Muscle weakness (generalized): Secondary | ICD-10-CM | POA: Diagnosis not present

## 2018-11-13 DIAGNOSIS — M1990 Unspecified osteoarthritis, unspecified site: Secondary | ICD-10-CM | POA: Diagnosis not present

## 2018-11-13 DIAGNOSIS — Z79899 Other long term (current) drug therapy: Secondary | ICD-10-CM | POA: Diagnosis not present

## 2018-11-13 DIAGNOSIS — I10 Essential (primary) hypertension: Secondary | ICD-10-CM | POA: Diagnosis not present

## 2018-11-13 DIAGNOSIS — Z791 Long term (current) use of non-steroidal anti-inflammatories (NSAID): Secondary | ICD-10-CM | POA: Diagnosis not present

## 2018-11-13 DIAGNOSIS — E119 Type 2 diabetes mellitus without complications: Secondary | ICD-10-CM | POA: Diagnosis not present

## 2018-11-13 DIAGNOSIS — I495 Sick sinus syndrome: Secondary | ICD-10-CM | POA: Diagnosis not present

## 2018-11-13 DIAGNOSIS — Z882 Allergy status to sulfonamides status: Secondary | ICD-10-CM | POA: Diagnosis not present

## 2018-11-13 HISTORY — PX: PACEMAKER IMPLANT: EP1218

## 2018-11-13 SURGERY — PACEMAKER IMPLANT
Anesthesia: LOCAL

## 2018-11-13 MED ORDER — LIDOCAINE HCL 1 % IJ SOLN
INTRAMUSCULAR | Status: AC
  Filled 2018-11-13: qty 60

## 2018-11-13 MED ORDER — MIDAZOLAM HCL 5 MG/5ML IJ SOLN
INTRAMUSCULAR | Status: DC | PRN
  Administered 2018-11-13: 1 mg via INTRAVENOUS

## 2018-11-13 MED ORDER — FENTANYL CITRATE (PF) 100 MCG/2ML IJ SOLN
INTRAMUSCULAR | Status: DC | PRN
  Administered 2018-11-13: 12.5 ug via INTRAVENOUS

## 2018-11-13 MED ORDER — LIDOCAINE HCL (PF) 1 % IJ SOLN
INTRAMUSCULAR | Status: DC | PRN
  Administered 2018-11-13: 50 mL

## 2018-11-13 MED ORDER — SODIUM CHLORIDE 0.9 % IV SOLN
INTRAVENOUS | Status: DC | PRN
  Administered 2018-11-13 (×2): 500 mL via INTRAVENOUS

## 2018-11-13 MED ORDER — HEPARIN (PORCINE) IN NACL 1000-0.9 UT/500ML-% IV SOLN
INTRAVENOUS | Status: AC
  Filled 2018-11-13: qty 500

## 2018-11-13 MED ORDER — ONDANSETRON HCL 4 MG/2ML IJ SOLN: 4.0000 mg | Freq: Four times a day (QID) | INTRAMUSCULAR | Status: DC | PRN

## 2018-11-13 MED ORDER — HEPARIN (PORCINE) IN NACL 1000-0.9 UT/500ML-% IV SOLN
INTRAVENOUS | Status: DC | PRN
  Administered 2018-11-13: 500 mL

## 2018-11-13 MED ORDER — IOHEXOL 350 MG/ML SOLN
INTRAVENOUS | Status: DC | PRN
  Administered 2018-11-13: 10 mL via INTRAVENOUS

## 2018-11-13 MED ORDER — CEFAZOLIN SODIUM-DEXTROSE 1-4 GM/50ML-% IV SOLN
1.0000 g | Freq: Four times a day (QID) | INTRAVENOUS | Status: AC
  Administered 2018-11-13 – 2018-11-14 (×3): 1 g via INTRAVENOUS
  Filled 2018-11-13 (×3): qty 50

## 2018-11-13 MED ORDER — MIDAZOLAM HCL 5 MG/5ML IJ SOLN
INTRAMUSCULAR | Status: AC
  Filled 2018-11-13: qty 5

## 2018-11-13 MED ORDER — SODIUM CHLORIDE 0.9 % IV SOLN
INTRAVENOUS | Status: AC
  Filled 2018-11-13: qty 2

## 2018-11-13 MED ORDER — CEFAZOLIN SODIUM-DEXTROSE 2-4 GM/100ML-% IV SOLN
INTRAVENOUS | Status: AC
  Filled 2018-11-13: qty 100

## 2018-11-13 MED ORDER — FENTANYL CITRATE (PF) 100 MCG/2ML IJ SOLN
INTRAMUSCULAR | Status: AC
  Filled 2018-11-13: qty 2

## 2018-11-13 MED ORDER — ACETAMINOPHEN 325 MG PO TABS
325.0000 mg | ORAL_TABLET | ORAL | Status: DC | PRN
  Administered 2018-11-14 – 2018-11-15 (×3): 650 mg via ORAL
  Filled 2018-11-13 (×3): qty 2

## 2018-11-13 MED FILL — Gentamicin Sulfate Inj 40 MG/ML: INTRAMUSCULAR | Qty: 80 | Status: AC

## 2018-11-13 SURGICAL SUPPLY — 7 items
CABLE SURGICAL S-101-97-12 (CABLE) ×2 IMPLANT
LEAD TENDRIL MRI 46CM LPA1200M (Lead) ×1 IMPLANT
LEAD TENDRIL MRI 52CM LPA1200M (Lead) ×1 IMPLANT
PACEMAKER ASSURITY DR-RF (Pacemaker) ×1 IMPLANT
PAD PRO RADIOLUCENT 2001M-C (PAD) ×2 IMPLANT
SHEATH 8FR PRELUDE SNAP 13 (SHEATH) ×2 IMPLANT
TRAY PACEMAKER INSERTION (PACKS) ×2 IMPLANT

## 2018-11-13 NOTE — Progress Notes (Signed)
Orthopedic Tech Progress Note Patient Details:  Erika Rice 13-Aug-1924 [id number] RN said patient has on arm sling Patient ID: Erika Guisinger, female   DOB: 14-Mar-1925, 85 y.o.   MRN: [id number]   Janit Pagan 11/13/2018, 11:43 AM

## 2018-11-13 NOTE — Progress Notes (Addendum)
Progress Note  Patient Name: Erika Rice Date of Encounter: 11/13/2018  Primary Cardiologist: Dr. Tomie China  Subjective   Sleeping comfortably, easily woken, no complaints, no SOB  Inpatient Medications    Scheduled Meds: . amLODipine  5 mg Oral Daily  . Chlorhexidine Gluconate Cloth  6 each Topical Daily  . cholecalciferol  5,000 Units Oral Q1200  . escitalopram  20 mg Oral Daily  . famotidine  20 mg Oral QHS  . fentaNYL  1 patch Transdermal Q72H  . gabapentin  800 mg Oral TID  . gentamicin irrigation  80 mg Irrigation On Call  . memantine  10 mg Oral BID  . pantoprazole  40 mg Oral QAC breakfast  . risperiDONE  0.5 mg Oral QHS  . sodium chloride flush  10-40 mL Intracatheter Q12H  . vitamin B-12  1,000 mcg Oral Q1200   Continuous Infusions: . sodium chloride 50 mL/hr at 11/13/18 0619  .  ceFAZolin (ANCEF) IV     PRN Meds: acetaminophen, albuterol, ALPRAZolam, meclizine, ondansetron (ZOFRAN) IV, oxyCODONE-acetaminophen, sodium chloride flush   Vital Signs    Vitals:   11/13/18 0435 11/13/18 0500 11/13/18 0600 11/13/18 0700  BP:  115/74 (!) 120/51 100/65  Pulse:      Resp:  15 11 17   Temp: (!) 96.2 F (35.7 C)     TempSrc: Axillary     SpO2:  100% 96% 97%  Weight:      Height:        Intake/Output Summary (Last 24 hours) at 11/13/2018 0754 Last data filed at 11/13/2018 11/15/2018 Gross per 24 hour  Intake 720 ml  Output 1100 ml  Net -380 ml   Filed Weights   11/10/18 1041  Weight: 75.3 kg    Telemetry    SB, generally 50's, infrequent slowing to 40's - Personally Reviewed  ECG    No new EKGs - Personally Reviewed  Physical Exam   GEN: frail, elderly appearing no acute distress.   Neck: 6 cm JVD Cardiac: Reg brady, no murmurs, rubs, or gallops.  Respiratory: CTA b/l. GI: Soft, nontender, non-distended  MS: No edema; No deformity. Neuro:  Nonfocal  Psych: Normal affect   L chest: dressing is dry, no hematoma  Labs    Chemistry Recent  Labs  Lab 11/07/18 1027 11/10/18 1100  NA 142 141  K 5.2 4.3  CL 100 102  CO2 28 30  GLUCOSE 117* 96  BUN 15 17  CREATININE 1.07* 1.06*  CALCIUM 10.3 10.1  GFRNONAA 45* 46*  GFRAA 52* 53*  ANIONGAP  --  9     Hematology Recent Labs  Lab 11/07/18 1027  WBC 4.6  RBC 4.35  HGB 13.4  HCT 41.5  MCV 95  MCH 30.8  MCHC 32.3  RDW 11.6*  PLT 209    Cardiac EnzymesNo results for input(s): TROPONINI in the last 168 hours. No results for input(s): TROPIPOC in the last 168 hours.   BNPNo results for input(s): BNP, PROBNP in the last 168 hours.   DDimer No results for input(s): DDIMER in the last 168 hours.   Radiology    No results found.  Cardiac Studies   none  Patient Profile     85 y.o. female w/PMHx of AFib (unclear when/type, not on a/c outpatient), COPD, DM, HTN, dementia, admitted for PPM implant 2/2 severe sinus node dysfunction, procedure was abandoned 2/2 to PTX/hemoptysis  Assessment & Plan    1. Profound sinus node dysfunction  2. PTX - CXR shows resolution.          Respiratory status is stable   Planned for PPM today, for R sided approach with Dr. Lovena Le, he has seen and examined her this AM  2. HTN     No changes today  3. DM (?)     Not on any tx outpatient  4. AFib (?)       This is unclear, when she had AFib what type      Not on a/c     Will be able to monitor via her pacer    For questions or updates, please contact [facility] Please consult [email or web address] for contact info under Cardiology/STEMI.      Signed, Baldwin Jamaica, PA-C  11/13/2018, 7:54 AM  Patient ID: Erika Rice, female   DOB: June 29, 1924, 85 y.o.   MRN: [id number]  EP Attending  Patient seen and examined. Agree with above. The patient is stable with HR's in the 30-45 range. She will undergo repeat PPM insertion via the right SCV. I have previously reviewed the indications/risks/benefits/goals/expectations of the procedure and she wishes to proceed.  Mikle Bosworth.D.

## 2018-11-13 NOTE — Discharge Summary (Addendum)
ELECTROPHYSIOLOGY PROCEDURE DISCHARGE SUMMARY    Patient ID: Erika Rice,  MRN: [id number], DOB/AGE: 04/12/1924 85 y.o.  Admit date: 11/10/2018 Discharge date: 11/15/2018  Primary Care Physician: Mateo Flow, MD  Primary Cardiologist: Dr. Geraldo Pitter Electrophysiologist: Dr. Curt Bears  Primary Discharge Diagnosis:  1. Symptomatic bradycardia status post pacemaker implantation this admission  Secondary Discharge Diagnosis:  1. HTN 2. DM      Diet controlled 3. Unclear h/o AFib     Follow via device  Allergies  Allergen Reactions  . Ace Inhibitors Other (See Comments)    Does not recall   . Sulfamethoxazole Rash and Other (See Comments)    Does not recall      Procedures This Admission:  1. 11/10/2018: PPM implant procedure aborted 2/2 hemoptysis, PTX 1.  Implantation of a SJM  chamber PPM on 11/13/2018 by Dr Lovena Le.  The patient received a St. Jude (serial number [id number]) pacemaker,  [employer]. Jude (serial number [id number]) right atrial lead and a [employer].Jude (serial number [id number]) right ventricular lead  There were no immediate post procedure complications. 2.  CXR on 11/14/2018 demonstrated no pneumothorax status post device implantation.   Brief HPI: Erika Pancoast is a 85 y.o. female was referred to electrophysiology in the outpatient setting for consideration of PPM implantation.  Past medical history includes above.  The patient has had symptomatic bradycardia without reversible causes identified.  Risks, benefits, and alternatives to PPM implantation were reviewed with the patient who wished to proceed.   Hospital Course:  The patient was admitted 11/10/2018 for PPM implant, unfortunately her procedure on Friday complicated by developing hemoptysis and PTX.  Her procedure abandoned.  She was monitored closely in the ICD, did not require chest tube, never developed any respiratory instability.  CXR showed resolution.  On 11/13/2018 she underwent implantation of a PPM on the R  side with details as outlined above. She was monitored on telemetry, predominately AP/VS, infrequent SR with intrinisc rates.  Both sites remained stable, without hematoma or ecchymosis.  The device was interrogated and found to be functioning normally.  CXR was obtained and demonstrated no pneumothorax status post device implantation.  Wound care, arm mobility, and restrictions were reviewed with the patient.  The patient outside of her arthritis feels well, no CP or SOB, she was examined by Dr. Curt Bears and considered stable for discharge to home.  PT evaluation recommended home health PT, the patient reports she ambulates minimally at baseline at home, lives with her son.  Patient feels ready to go, comfortable with plans for discharge and home health visiting.  Plan for discharge once arrangements are made.  Called son this AM (x2), no answer, d/w RN.   Physical Exam: Vitals:   11/14/18 1714 11/14/18 2026 11/15/18 0531 11/15/18 1151  BP:  (!) 142/61 (!) 171/79 (!) 149/77  Pulse:  (!) 50 (!) 50 (!) 50  Resp: 18     Temp:  98.2 F (36.8 C) 97.9 F (36.6 C)   TempSrc:  Oral Oral   SpO2:  95% 92% 94%  Weight:   72.1 kg   Height:         GEN- The patient is well appearing, alert and oriented x 3 today.   HEENT: normocephalic, atraumatic; sclera clear, conjunctiva pink; hearing intact; oropharynx clear; neck supple, no JVP Lungs- CTA b/l, normal work of breathing.  No wheezes, rales, rhonchi Heart- RRR, no murmurs, rubs or gallops, PMI not laterally displaced GI- soft, non-tender, non-distended Extremities- no  clubbing, cyanosis, or edema MS- no significant deformity or atrophy Skin- warm and dry, no rash or lesion, left and right side sites are both stable, without hematoma/ecchymosis Psych- euthymic mood, full affect Neuro- no gross deficits   Labs:   Lab Results  Component Value Date   WBC 4.6 11/07/2018   HGB 13.4 11/07/2018   HCT 41.5 11/07/2018   MCV 95 11/07/2018   PLT  209 11/07/2018    Recent Labs  Lab 11/10/18 1100  NA 141  K 4.3  CL 102  CO2 30  BUN 17  CREATININE 1.06*  CALCIUM 10.1  GLUCOSE 96    Discharge Medications:  Allergies as of 11/15/2018      Reactions   Ace Inhibitors Other (See Comments)   Does not recall    Sulfamethoxazole Rash, Other (See Comments)   Does not recall       Medication List    STOP taking these medications   metroNIDAZOLE 500 MG tablet Commonly known as: FLAGYL     TAKE these medications   acetaminophen 500 MG tablet Commonly known as: TYLENOL Take 500 mg by mouth every 6 (six) hours as needed (pain.). Notes to patient: As needed for pain    albuterol (2.5 MG/3ML) 0.083% nebulizer solution Commonly known as: PROVENTIL Inhale 2.5 mg into the lungs every 6 (six) hours as needed for wheezing or shortness of breath. Notes to patient: As needed for shortness of breath and wheezing    ALPRAZolam 0.25 MG tablet Commonly known as: XANAX Take 0.25 mg by mouth daily as needed for anxiety. Notes to patient: As needed for anxiety   amLODipine 5 MG tablet Commonly known as: NORVASC Take 5 mg by mouth daily. Notes to patient: Lowers blood pressure Decreases chest pain    celecoxib 200 MG capsule Commonly known as: CELEBREX Take 200 mg by mouth daily at 12 noon. Notes to patient: Decreases inflammation  Controls pain    escitalopram 20 MG tablet Commonly known as: LEXAPRO Take 20 mg by mouth daily. Notes to patient: Depression/pain   famotidine 20 MG tablet Commonly known as: PEPCID Take 20 mg by mouth at bedtime. Notes to patient: Treat acid reflux Prevents heartburn   fentaNYL 25 MCG/HR Commonly known as: DURAGESIC Place 1 patch onto the skin every 3 (three) days. Notes to patient: Treat pain    gabapentin 800 MG tablet Commonly known as: NEURONTIN Take 800 mg by mouth 3 (three) times daily. Notes to patient: Treat nerve pain   Lubricant Eye Drops 0.4-0.3 % Soln Generic drug:  Polyethyl Glycol-Propyl Glycol Place 1 drop into both eyes 3 (three) times daily as needed (dry/irritated eyes.). Notes to patient: As needed for dry/irritated eyes   meclizine 12.5 MG tablet Commonly known as: ANTIVERT Take 12.5 mg by mouth every 6 (six) hours as needed for dizziness. Notes to patient: As needed for dizziness   memantine 10 MG tablet Commonly known as: NAMENDA Take 10 mg by mouth 2 (two) times daily. Notes to patient: Treat dementia  Improves memory and awareness   metoprolol succinate 25 MG 24 hr tablet Commonly known as: TOPROL-XL Take 1 tablet (25 mg total) by mouth daily. Notes to patient: Decreases work of the heart  Lowers blood pressure and heart rate   oxyCODONE-acetaminophen 7.5-325 MG tablet Commonly known as: PERCOCET Take 1 tablet by mouth every 8 (eight) hours as needed for moderate pain. Notes to patient: As needed for moderate pain    pantoprazole 40 MG tablet Commonly  known as: PROTONIX Take 40 mg by mouth daily before breakfast. Notes to patient: Treat acid reflux Prevents heartburn   polyvinyl alcohol 1.4 % ophthalmic solution Commonly known as: LIQUIFILM TEARS Place 1 drop into both eyes as needed for dry eyes. Notes to patient: As needed for dry eyes   risperiDONE 0.25 MG tablet Commonly known as: RISPERDAL Take 0.5 mg by mouth at bedtime. Notes to patient: Dementia    vitamin B-12 1000 MCG tablet Commonly known as: CYANOCOBALAMIN Take 1,000 mcg by mouth daily at 12 noon. Notes to patient: Vitamin B-12 supplement    Vitamin D-3 125 MCG (5000 UT) Tabs Take 5,000 Units by mouth daily at 12 noon. Notes to patient: Vitamin D supplement       Disposition:  Home  Discharge Instructions    Diet - low sodium heart healthy   Complete by: As directed    Increase activity slowly   Complete by: As directed      Follow-up Information    [facility] Office Follow up.   Specialty: Cardiology Why: 11/21/2018 @  12:30PM, wound check visit Contact information: [address], [address] [place] [address] [phone]       Regan Lemming, MD Follow up.   Specialty: Cardiology Why: 02/19/2019 @ 11:00AM Contact information: [address] [place] [address] [phone]        [facility], [facility] Follow up.   Specialty: Home Health Services Why: Physical Therapy- office to call you with an appointment time.  Contact information: [address] [place] [address] [phone]           Duration of Discharge Encounter: Greater than 30 minutes including physician time.  Norma Fredrickson, PA-C 11/15/2018 12:39 PM  I have seen and examined this patient with Francis Dowse.  Agree with above, note added to reflect my findings.  On exam, RRR, no murmurs, lungs clear.  Patient mid to the hospital for pacemaker implant.  Procedure was complicated by pneumothorax and hemoptysis.  Pacemaker implant was held through the weekend and reimplanted on the right side on Monday.  Chest x-ray and interrogation without issue.  Plan for discharge with follow-up in device clinic.  Bettyjane Shenoy M. Denise Washburn MD 11/15/2018 12:43 PM

## 2018-11-13 NOTE — Discharge Instructions (Signed)
° ° °  Supplemental Discharge Instructions for  Pacemaker/Defibrillator Patients  Activity No heavy lifting or vigorous activity with your left/right arm for 6 to 8 weeks.  Do not raise your left/right arm above your head for one week.  Gradually raise your affected arm as drawn below.             11/17/2018                 11/18/2018                 11/19/2018              11/20/2018 __  NO DRIVING until cleared to at your wound check visit.  WOUND CARE - Keep the wound area clean and dry.  Do not get either areas wet, no showers until cleared to at your wound check visit . - The tape/steri-strips on your wound will fall off; do not pull them off.  No bandage is needed on the site.  DO  NOT apply any creams, oils, or ointments to the wound area. - If you notice any drainage or discharge from the wound, any swelling or bruising at the site, or you develop a fever > 101? F after you are discharged home, call the office at once.  Special Instructions - You are still able to use cellular telephones; use the ear opposite the side where you have your pacemaker/defibrillator.  Avoid carrying your cellular phone near your device. - When traveling through airports, show security personnel your identification card to avoid being screened in the metal detectors.  Ask the security personnel to use the hand wand. - Avoid arc welding equipment, MRI testing (magnetic resonance imaging), TENS units (transcutaneous nerve stimulators).  Call the office for questions about other devices. - Avoid electrical appliances that are in poor condition or are not properly grounded. - Microwave ovens are safe to be near or to operate.

## 2018-11-13 NOTE — Interval H&P Note (Signed)
History and Physical Interval Note:  11/13/2018 8:42 AM  Erika Rice  has presented today for surgery, with the diagnosis of bradycardia.  The various methods of treatment have been discussed with the patient and family. After consideration of risks, benefits and other options for treatment, the patient has consented to  Procedure(s): PACEMAKER IMPLANT (N/A) as a surgical intervention.  The patient's history has been reviewed, patient examined, no change in status, stable for surgery.  I have reviewed the patient's chart and labs.  Questions were answered to the patient's satisfaction.     Cristopher Peru

## 2018-11-14 ENCOUNTER — Ambulatory Visit (HOSPITAL_COMMUNITY): Payer: Medicare Other

## 2018-11-14 DIAGNOSIS — M1990 Unspecified osteoarthritis, unspecified site: Secondary | ICD-10-CM | POA: Diagnosis not present

## 2018-11-14 DIAGNOSIS — Z79899 Other long term (current) drug therapy: Secondary | ICD-10-CM | POA: Diagnosis not present

## 2018-11-14 DIAGNOSIS — Z882 Allergy status to sulfonamides status: Secondary | ICD-10-CM | POA: Diagnosis not present

## 2018-11-14 DIAGNOSIS — R2681 Unsteadiness on feet: Secondary | ICD-10-CM | POA: Diagnosis not present

## 2018-11-14 DIAGNOSIS — I495 Sick sinus syndrome: Secondary | ICD-10-CM | POA: Diagnosis not present

## 2018-11-14 DIAGNOSIS — I10 Essential (primary) hypertension: Secondary | ICD-10-CM | POA: Diagnosis not present

## 2018-11-14 DIAGNOSIS — I517 Cardiomegaly: Secondary | ICD-10-CM | POA: Diagnosis not present

## 2018-11-14 DIAGNOSIS — Z791 Long term (current) use of non-steroidal anti-inflammatories (NSAID): Secondary | ICD-10-CM | POA: Diagnosis not present

## 2018-11-14 DIAGNOSIS — Z950 Presence of cardiac pacemaker: Secondary | ICD-10-CM | POA: Diagnosis not present

## 2018-11-14 DIAGNOSIS — I480 Paroxysmal atrial fibrillation: Secondary | ICD-10-CM | POA: Diagnosis not present

## 2018-11-14 DIAGNOSIS — M6281 Muscle weakness (generalized): Secondary | ICD-10-CM | POA: Diagnosis not present

## 2018-11-14 DIAGNOSIS — E119 Type 2 diabetes mellitus without complications: Secondary | ICD-10-CM | POA: Diagnosis not present

## 2018-11-14 MED ORDER — METOPROLOL SUCCINATE ER 25 MG PO TB24
25.0000 mg | ORAL_TABLET | Freq: Every day | ORAL | Status: DC
  Administered 2018-11-14 – 2018-11-15 (×2): 25 mg via ORAL
  Filled 2018-11-14 (×3): qty 1

## 2018-11-14 MED FILL — Lidocaine HCl Local Inj 1%: INTRAMUSCULAR | Qty: 60 | Status: AC

## 2018-11-14 MED FILL — Gentamicin Sulfate Inj 40 MG/ML: INTRAMUSCULAR | Qty: 80 | Status: AC

## 2018-11-14 NOTE — Evaluation (Addendum)
Physical Therapy Evaluation Patient Details Name: Erika Rice MRN: [id number] DOB: 10-01-1924 Today's Date: 11/14/2018   History of Present Illness  pt is a 85 y/o female admitted for evaluation of sinus node dysfunction.  pt s/p successful 2nd attempt at pacemaker implantation.  PMHx:  DM2, DJD, COPD, afib  Clinical Impression  Pt admitted with/for evaluation of sinus node dysfunction and ended up with pacemaker.   Pt is mobilizing at a moderate assist level presently, limited by general immobility of the Right arm due to sternal precautions.   Pt currently limited functionally due to the problems listed below.  (see problems list.)  Pt will benefit from PT to maximize function and safety to be able to get home safely with available assist.     Follow Up Recommendations Home health PT;Supervision/Assistance - 24 hour(SNF if son does not think he can provide min/moderate assist)    Equipment Recommendations  Other (comment);None recommended by PT(TBA)    Recommendations for Other Services       Precautions / Restrictions Precautions Precautions: Sternal;Fall Restrictions Weight Bearing Restrictions: No      Mobility  Bed Mobility Overal bed mobility: Needs Assistance Bed Mobility: Supine to Sit     Supine to sit: Mod assist     General bed mobility comments: cues for technique.  assist to bridge to EOB, truncal assist to come forward and scoot to EOB  Transfers Overall transfer level: Needs assistance   Transfers: Sit to/from Stand;Stand Pivot Transfers Sit to Stand: Mod assist;+2 safety/equipment Stand pivot transfers: Mod assist;+2 safety/equipment       General transfer comment: stability assist  Ambulation/Gait Ambulation/Gait assistance: Min assist;Mod assist;+2 physical assistance;+2 safety/equipment Gait Distance (Feet): 12 Feet(x1 and 15 back to recliner) Assistive device: 1 person hand held assist Gait Pattern/deviations: Step-through pattern;Decreased  step length - left;Decreased step length - right;Decreased stride length   Gait velocity interpretation: <1.31 ft/sec, indicative of household ambulator General Gait Details: Generally unsteady and antilgic with need for stability assist since usually uses a Materials engineer    Modified Rankin (Stroke Patients Only)       Balance Overall balance assessment: Needs assistance Sitting-balance support: Single extremity supported;No upper extremity supported Sitting balance-Leahy Scale: Poor       Standing balance-Leahy Scale: Poor                               Pertinent Vitals/Pain Pain Assessment: Faces Faces Pain Scale: Hurts even more Pain Location: knees Pain Descriptors / Indicators: Squeezing;Stabbing Pain Intervention(s): Monitored during session    Home Living Family/patient expects to be discharged to:: Private residence Living Arrangements: Children(son) Available Help at Discharge: Family;Available 24 hours/day;Personal care attendant(PCA 3x/wk 1 hour/day) Type of Home: House Home Access: Ramped entrance     Home Layout: One level Home Equipment: Walker - 2 wheels;Wheelchair - manual      Prior Function Level of Independence: Needs assistance   Gait / Transfers Assistance Needed: Use of RW or w/c to complete ADL's and walk/roll around the home.  ADL's / Homemaking Assistance Needed: PCA for showering, otherwise minor assist from her son.        Hand Dominance        Extremity/Trunk Assessment        Lower Extremity Assessment Lower Extremity Assessment: Generalized weakness(bil knee stiffness with painful ROM)  Communication      Cognition Arousal/Alertness: Awake/alert Behavior During Therapy: WFL for tasks assessed/performed Overall Cognitive Status: Within Functional Limits for tasks assessed(NT formally)                                        General Comments General  comments (skin integrity, edema, etc.): HR throughout registered at 49-53 bpm ( paced)    Exercises     Assessment/Plan    PT Assessment Patient needs continued PT services  PT Problem List Decreased strength;Decreased activity tolerance;Decreased balance;Decreased mobility;Pain;Cardiopulmonary status limiting activity       PT Treatment Interventions DME instruction;Gait training;Functional mobility training;Therapeutic activities;Patient/family education;Balance training    PT Goals (Current goals can be found in the Care Plan section)  Acute Rehab PT Goals Patient Stated Goal: to get home. PT Goal Formulation: With patient Time For Goal Achievement: 11/21/18 Potential to Achieve Goals: Good    Frequency Min 3X/week   Barriers to discharge        Co-evaluation               AM-PAC PT "6 Clicks" Mobility  Outcome Measure Help needed turning from your back to your side while in a flat bed without using bedrails?: A Lot Help needed moving from lying on your back to sitting on the side of a flat bed without using bedrails?: A Lot Help needed moving to and from a bed to a chair (including a wheelchair)?: A Lot Help needed standing up from a chair using your arms (e.g., wheelchair or bedside chair)?: A Lot Help needed to walk in hospital room?: A Lot Help needed climbing 3-5 steps with a railing? : Total 6 Click Score: 11    End of Session   Activity Tolerance: Patient tolerated treatment well;Patient limited by pain;Patient limited by lethargy Patient left: in chair;with call bell/phone within reach;with chair alarm set Nurse Communication: Mobility status PT Visit Diagnosis: Unsteadiness on feet (R26.81);Muscle weakness (generalized) (M62.81)    Time: 9485-4627 PT Time Calculation (min) (ACUTE ONLY): 28 min   Charges:   PT Evaluation $PT Eval Moderate Complexity: 1 Mod PT Treatments $Gait Training: 8-22 mins        11/14/2018  Donnella Sham, PT Acute  Rehabilitation Services [phone]  (pager) [phone]  (office)  Erika Rice 11/14/2018, 6:06 PM

## 2018-11-14 NOTE — Progress Notes (Addendum)
Progress Note  Patient Name: Erika Rice Date of Encounter: 11/14/2018  Primary Cardiologist: Dr. Tomie China  Subjective   More awake and conversational today, no complaints, no SOB  Inpatient Medications    Scheduled Meds: . amLODipine  5 mg Oral Daily  . Chlorhexidine Gluconate Cloth  6 each Topical Daily  . cholecalciferol  5,000 Units Oral Q1200  . escitalopram  20 mg Oral Daily  . famotidine  20 mg Oral QHS  . fentaNYL  1 patch Transdermal Q72H  . gabapentin  800 mg Oral TID  . memantine  10 mg Oral BID  . pantoprazole  40 mg Oral QAC breakfast  . risperiDONE  0.5 mg Oral QHS  . sodium chloride flush  10-40 mL Intracatheter Q12H  . vitamin B-12  1,000 mcg Oral Q1200   Continuous Infusions: . sodium chloride 10 mL/hr at 11/14/18 0000   PRN Meds: sodium chloride, acetaminophen, albuterol, ALPRAZolam, meclizine, ondansetron (ZOFRAN) IV, oxyCODONE-acetaminophen, sodium chloride flush   Vital Signs    Vitals:   11/14/18 0600 11/14/18 0700 11/14/18 0800 11/14/18 0811  BP:  (!) 169/71 (!) 144/116   Pulse:      Resp: 15 14 17    Temp:    97.8 F (36.6 C)  TempSrc:    Oral  SpO2:  96% 100%   Weight:      Height:        Intake/Output Summary (Last 24 hours) at 11/14/2018 0854 Last data filed at 11/14/2018 0400 Gross per 24 hour  Intake 521.56 ml  Output 1300 ml  Net -778.44 ml   Filed Weights   11/10/18 1041  Weight: 75.3 kg    Telemetry    SR, intermittent AP rave Vpacing noted - Personally Reviewed  ECG    SR, RBBB, some AV paced beats - Personally Reviewed  Physical Exam   GEN: frail, elderly appearing no acute distress.   Neck: 6 cm JVD Cardiac: RRR, no murmurs, rubs, or gallops.  Respiratory: CTA b/l. GI: Soft, nontender, non-distended  MS: No edema; No deformity. Neuro:  Nonfocal  Psych: Normal affect   L and R chest: dressings are dry, no hematoma  Labs    Chemistry Recent Labs  Lab 11/07/18 1027 11/10/18 1100  NA 142 141  K  5.2 4.3  CL 100 102  CO2 28 30  GLUCOSE 117* 96  BUN 15 17  CREATININE 1.07* 1.06*  CALCIUM 10.3 10.1  GFRNONAA 45* 46*  GFRAA 52* 53*  ANIONGAP  --  9     Hematology Recent Labs  Lab 11/07/18 1027  WBC 4.6  RBC 4.35  HGB 13.4  HCT 41.5  MCV 95  MCH 30.8  MCHC 32.3  RDW 11.6*  PLT 209    Cardiac EnzymesNo results for input(s): TROPONINI in the last 168 hours. No results for input(s): TROPIPOC in the last 168 hours.   BNPNo results for input(s): BNP, PROBNP in the last 168 hours.   DDimer No results for input(s): DDIMER in the last 168 hours.   Radiology    Dg Chest 2 View Result Date: 11/14/2018 CLINICAL DATA:  Pacer placement EXAM: CHEST - 2 VIEW COMPARISON:  11/11/2018 FINDINGS: Right pacer has been placed with leads in the right atrium and right ventricle. No pneumothorax. Stable elevation of the right hemidiaphragm. Cardiomegaly. No confluent opacities or effusions. IMPRESSION: Placement of right side pacer without pneumothorax. Stable elevation of the right hemidiaphragm. Stable cardiomegaly. Electronically Signed   By: 11/13/2018 M.D.  On: 11/14/2018 07:57    Cardiac Studies   none  Patient Profile     85 y.o. female w/PMHx of AFib (unclear when/type, not on a/c outpatient), COPD, DM, HTN, dementia, admitted for PPM implant 2/2 severe sinus node dysfunction, procedure was abandoned 2/2 to PTX/hemoptysis  Assessment & Plan    1. Profound sinus node dysfunction      2. PTX - CXR shows resolution.          Respiratory status is stable   S/p PPM implant yesterday by Dr.Quadarius Henton Site is stable CXR reviewed with Dr. Lovena Le, leads look OK, read with no PTX by radiologist Device check this AM with stable function Patient told Dr. Lovena Le she did not feel ready for home today Will move out of ICD and mobilize today, PT consult  Anticipate discharge tomorrow  2. HTN     Will start Toprol today  3. DM (?)     Not on any tx outpatient  4. AFib (?)        This is unclear, when she had AFib what type      Not on a/c     Will be able to monitor via her pacer    For questions or updates, please contact [place] Please consult [email or web address] for contact info under Cardiology/STEMI.      Signed, Baldwin Jamaica, PA-C  11/14/2018, 8:54 AM  Patient ID: Erika Rice, female   DOB: [birth year], 85 y.o.   MRN: [id number]  EP Attending  Patient seen and examined. Agree with above. The patient is stable after PPM insertion. Her device interrogation demonstrates normal DDD PM function. Her CXR looks good. She could be discharged home but we will ask PT to evaluate first.  Mikle Bosworth.D.

## 2018-11-15 DIAGNOSIS — I480 Paroxysmal atrial fibrillation: Secondary | ICD-10-CM | POA: Diagnosis not present

## 2018-11-15 DIAGNOSIS — Z79899 Other long term (current) drug therapy: Secondary | ICD-10-CM | POA: Diagnosis not present

## 2018-11-15 DIAGNOSIS — Z882 Allergy status to sulfonamides status: Secondary | ICD-10-CM | POA: Diagnosis not present

## 2018-11-15 DIAGNOSIS — R2681 Unsteadiness on feet: Secondary | ICD-10-CM | POA: Diagnosis not present

## 2018-11-15 DIAGNOSIS — Z791 Long term (current) use of non-steroidal anti-inflammatories (NSAID): Secondary | ICD-10-CM | POA: Diagnosis not present

## 2018-11-15 DIAGNOSIS — I495 Sick sinus syndrome: Secondary | ICD-10-CM | POA: Diagnosis not present

## 2018-11-15 DIAGNOSIS — I10 Essential (primary) hypertension: Secondary | ICD-10-CM | POA: Diagnosis not present

## 2018-11-15 DIAGNOSIS — M1990 Unspecified osteoarthritis, unspecified site: Secondary | ICD-10-CM | POA: Diagnosis not present

## 2018-11-15 DIAGNOSIS — M6281 Muscle weakness (generalized): Secondary | ICD-10-CM | POA: Diagnosis not present

## 2018-11-15 DIAGNOSIS — Z950 Presence of cardiac pacemaker: Secondary | ICD-10-CM | POA: Diagnosis not present

## 2018-11-15 DIAGNOSIS — E119 Type 2 diabetes mellitus without complications: Secondary | ICD-10-CM | POA: Diagnosis not present

## 2018-11-15 MED ORDER — METOPROLOL SUCCINATE ER 25 MG PO TB24: 25.0000 mg | ORAL_TABLET | Freq: Every day | ORAL | 6 refills | Status: DC

## 2018-11-15 NOTE — TOC Initial Note (Signed)
Transition of Care [facility]) - Initial/Assessment Note    Patient Details  Name: Erika Rice MRN: [id number] Date of Birth: 1924/08/04  [facility] CM/SW Contact:    Gala Lewandowsky, RN Phone Number: 11/15/2018, 12:10 PM  Clinical Narrative: Pt plan for home with [facility]-home with son. Plan to transition home and [facility] to see- Medicare. gov list presented-choice [facility]-unable to see patient. [facility] willing to accept and SOC to begin within 24-48 hours post transition home. No further needs from CM at this time.                   Expected Discharge Plan: Home w Home Health Services Barriers to Discharge: No Barriers Identified   Patient Goals and CMS Choice Patient states their goals for this hospitalization and ongoing recovery are:: "to go home" CMS Medicare.gov Compare Post Acute Care list provided to:: Patient Choice offered to / list presented to : Patient  Expected Discharge Plan and Services Expected Discharge Plan: Home w Home Health Services In-house Referral: NA   Post Acute Care Choice: Home Health Living arrangements for the past 2 months: Single Family Home Expected Discharge Date: 11/15/18                         HH Arranged: PT HH Agency: [facility] Date [facility] Agency Contacted: 11/15/18 Time HH Agency Contacted: [id number] Representative spoke with at [facility] Agency: Kandee Keen  Prior Living Arrangements/Services Living arrangements for the past 2 months: Single Family Home Lives with:: Adult Children Patient language and need for interpreter reviewed:: Yes Do you feel safe going back to the place where you live?: Yes      Need for Family Participation in Patient Care: Yes (Comment) Care giver support system in place?: Yes (comment)   Criminal Activity/Legal Involvement Pertinent to Current Situation/Hospitalization: No - Comment as needed  Activities of Daily Living Home Assistive Devices/Equipment: Environmental consultant (specify  type), Wheelchair ADL Screening (condition at time of admission) Patient's cognitive ability adequate to safely complete daily activities?: No Is the patient deaf or have difficulty hearing?: Yes Does the patient have difficulty seeing, even when wearing glasses/contacts?: Yes Does the patient have difficulty concentrating, remembering, or making decisions?: Yes Patient able to express need for assistance with ADLs?: Yes Does the patient have difficulty dressing or bathing?: Yes Independently performs ADLs?: No Communication: Independent Dressing (OT): Needs assistance Is this a change from baseline?: Pre-admission baseline Grooming: Needs assistance Is this a change from baseline?: Pre-admission baseline Feeding: Independent Bathing: Needs assistance Is this a change from baseline?: Pre-admission baseline Toileting: Needs assistance Is this a change from baseline?: Pre-admission baseline In/Out Bed: Independent Walks in Home: Independent Does the patient have difficulty walking or climbing stairs?: Yes Weakness of Legs: None Weakness of Arms/Hands: None  Permission Sought/Granted Permission sought to share information with : Facility Medical sales representative, Family Supports Permission granted to share information with : Yes, Verbal Permission Granted     Permission granted to share info w AGENCY: [facility]        Emotional Assessment Appearance:: Appears stated age Attitude/Demeanor/Rapport: Engaged Affect (typically observed): Accepting Orientation: : Oriented to Self, Oriented to Place, Oriented to  Time, Oriented to Situation Alcohol / Substance Use: Not Applicable Psych Involvement: No (comment)  Admission diagnosis:  Bradycardia Patient Active Problem List   Diagnosis Date Noted  . Pneumothorax 11/10/2018  . SSS (sick sinus syndrome) (HCC) 11/10/2018  . Acute respiratory  failure with hypoxia ([place]) 07/07/2018  . Suspected Covid-19 Virus Infection 07/07/2018  .  Asthma 07/07/2018  . Dementia ([place]) 07/07/2018  . Essential hypertension 07/07/2018  . Depression 07/07/2018  . GERD (gastroesophageal reflux disease) 07/07/2018  . Bradycardia 12/26/2015  . Dizziness 12/26/2015  . Long term current use of opiate analgesic 06/08/2012  . Pain syndrome, chronic 06/08/2012  . Back pain 03/08/2011  . Joint pain 03/08/2011  . Neck pain 03/08/2011   PCP:  Mateo Flow, MD Pharmacy:   [place], [place] - [address] [place] [address] Phone: [phone] Fax: [phone]     Social Determinants of Health (SDOH) Interventions    Readmission Risk Interventions No flowsheet data found.

## 2018-11-15 NOTE — Plan of Care (Signed)
Patient ready for discharge. Reviewed all information with patient and son. [place] set up for PT and patient has aid to bath her 2 times a week. Pacemaker discharge instructions reviewed and patient and son deny and questions.

## 2018-11-16 DIAGNOSIS — M1990 Unspecified osteoarthritis, unspecified site: Secondary | ICD-10-CM | POA: Diagnosis not present

## 2018-11-16 DIAGNOSIS — I10 Essential (primary) hypertension: Secondary | ICD-10-CM | POA: Diagnosis not present

## 2018-11-16 DIAGNOSIS — E119 Type 2 diabetes mellitus without complications: Secondary | ICD-10-CM | POA: Diagnosis not present

## 2018-11-16 DIAGNOSIS — I4891 Unspecified atrial fibrillation: Secondary | ICD-10-CM | POA: Diagnosis not present

## 2018-11-16 DIAGNOSIS — Z48812 Encounter for surgical aftercare following surgery on the circulatory system: Secondary | ICD-10-CM | POA: Diagnosis not present

## 2018-11-20 ENCOUNTER — Telehealth: Payer: Medicare Other | Admitting: Cardiology

## 2018-11-21 ENCOUNTER — Ambulatory Visit (INDEPENDENT_AMBULATORY_CARE_PROVIDER_SITE_OTHER): Payer: Medicare Other | Admitting: *Deleted

## 2018-11-21 ENCOUNTER — Other Ambulatory Visit: Payer: Self-pay

## 2018-11-21 ENCOUNTER — Telehealth: Payer: Self-pay

## 2018-11-21 DIAGNOSIS — I495 Sick sinus syndrome: Secondary | ICD-10-CM | POA: Diagnosis not present

## 2018-11-21 NOTE — Telephone Encounter (Signed)
    COVID-19 Pre-Screening Questions:  . In the past 7 to 10 days have you had a cough,  shortness of breath, headache, congestion, fever (100 or greater) body aches, chills, sore throat, or sudden loss of taste or sense of smell? NO  . Have you been around anyone with known Covid 19. No . Have you been around anyone who is awaiting Covid 19 test results in the past 7 to 10 days? No . Have you been around anyone who has been exposed to Covid 19, or has mentioned symptoms of Covid 19 within the past 7 to 10 days? No  If you have any concerns/questions about symptoms patients report during screening (either on the phone or at threshold). Contact the provider seeing the patient or DOD for further guidance.  If neither are available contact a member of the leadership team.          Pt son answered no to all covid-19 prescreening questions. I asked him to make sure she wear a mask. I told him he will have to wear a mask as well and be able to answer the same questions as well. I told him if anything changes between now and her appointment time to please give [place] a call. Pt son verbalized understanding.

## 2018-11-22 DIAGNOSIS — I4891 Unspecified atrial fibrillation: Secondary | ICD-10-CM | POA: Diagnosis not present

## 2018-11-22 DIAGNOSIS — E119 Type 2 diabetes mellitus without complications: Secondary | ICD-10-CM | POA: Diagnosis not present

## 2018-11-22 DIAGNOSIS — M1990 Unspecified osteoarthritis, unspecified site: Secondary | ICD-10-CM | POA: Diagnosis not present

## 2018-11-22 DIAGNOSIS — I10 Essential (primary) hypertension: Secondary | ICD-10-CM | POA: Diagnosis not present

## 2018-11-22 DIAGNOSIS — Z48812 Encounter for surgical aftercare following surgery on the circulatory system: Secondary | ICD-10-CM | POA: Diagnosis not present

## 2018-11-22 LAB — CUP PACEART INCLINIC DEVICE CHECK
Battery Remaining Longevity: 87 mo
Battery Voltage: 3.01 V
Brady Statistic RA Percent Paced: 85 %
Brady Statistic RV Percent Paced: 65 %
Date Time Interrogation Session: 20200825163609
Implantable Lead Implant Date: 20200817
Implantable Lead Implant Date: 20200817
Implantable Lead Location: 753859
Implantable Lead Location: 753860
Implantable Pulse Generator Implant Date: 20200817
Lead Channel Impedance Value: 425 Ohm
Lead Channel Impedance Value: 512.5 Ohm
Lead Channel Pacing Threshold Amplitude: 0.75 V
Lead Channel Pacing Threshold Amplitude: 0.75 V
Lead Channel Pacing Threshold Pulse Width: 0.5 ms
Lead Channel Pacing Threshold Pulse Width: 0.5 ms
Lead Channel Sensing Intrinsic Amplitude: 0.4 mV
Lead Channel Setting Pacing Amplitude: 1 V
Lead Channel Setting Pacing Amplitude: 3.5 V
Lead Channel Setting Pacing Pulse Width: 0.5 ms
Lead Channel Setting Sensing Sensitivity: 2 mV
Pulse Gen Model: 2272
Pulse Gen Serial Number: 9151060

## 2018-11-22 NOTE — Progress Notes (Signed)
Pacemaker check in clinic. Normal device function. Thresholds, sensing, impedances consistent with previous measurements. Device programmed for extra safety margin until 91 day f/U. No mode switch or high ventricular rates noted. Device programmed at appropriate safety margins. Histogram distribution appropriate for patient activity level. Device programmed to optimize intrinsic conduction. Estimated longevity 7 yrs 3 months. Patient enrolled in remote follow-up, next remote 02/13/19. Patient education completed.

## 2018-11-23 DIAGNOSIS — Z950 Presence of cardiac pacemaker: Secondary | ICD-10-CM | POA: Diagnosis not present

## 2018-11-23 DIAGNOSIS — R001 Bradycardia, unspecified: Secondary | ICD-10-CM | POA: Diagnosis not present

## 2018-11-23 DIAGNOSIS — R0902 Hypoxemia: Secondary | ICD-10-CM | POA: Diagnosis not present

## 2018-11-24 DIAGNOSIS — I10 Essential (primary) hypertension: Secondary | ICD-10-CM | POA: Diagnosis not present

## 2018-11-24 DIAGNOSIS — I4891 Unspecified atrial fibrillation: Secondary | ICD-10-CM | POA: Diagnosis not present

## 2018-11-24 DIAGNOSIS — M1990 Unspecified osteoarthritis, unspecified site: Secondary | ICD-10-CM | POA: Diagnosis not present

## 2018-11-24 DIAGNOSIS — E119 Type 2 diabetes mellitus without complications: Secondary | ICD-10-CM | POA: Diagnosis not present

## 2018-11-24 DIAGNOSIS — Z48812 Encounter for surgical aftercare following surgery on the circulatory system: Secondary | ICD-10-CM | POA: Diagnosis not present

## 2018-11-28 ENCOUNTER — Encounter: Payer: Self-pay | Admitting: Cardiology

## 2018-11-28 ENCOUNTER — Ambulatory Visit (INDEPENDENT_AMBULATORY_CARE_PROVIDER_SITE_OTHER): Payer: Medicare Other | Admitting: Cardiology

## 2018-11-28 ENCOUNTER — Other Ambulatory Visit: Payer: Self-pay

## 2018-11-28 VITALS — BP 120/78 | HR 50 | Ht 63.0 in | Wt 162.0 lb

## 2018-11-28 DIAGNOSIS — Z950 Presence of cardiac pacemaker: Secondary | ICD-10-CM | POA: Diagnosis not present

## 2018-11-28 DIAGNOSIS — I495 Sick sinus syndrome: Secondary | ICD-10-CM

## 2018-11-28 DIAGNOSIS — R001 Bradycardia, unspecified: Secondary | ICD-10-CM | POA: Diagnosis not present

## 2018-11-28 NOTE — Patient Instructions (Signed)
Medication Instructions:  Your physician recommends that you continue on your current medications as directed. Please refer to the Current Medication list given to you today.  If you need a refill on your cardiac medications before your next appointment, please call your pharmacy.   Lab work: NONE If you have labs (blood work) drawn today and your tests are completely normal, you will receive your results only by: . MyChart Message (if you have MyChart) OR . A paper copy in the mail If you have any lab test that is abnormal or we need to change your treatment, we will call you to review the results.  Testing/Procedures: You had an EKG performed today  Follow-Up: At [facility], you and your health needs are our priority.  As part of our continuing mission to provide you with exceptional heart care, we have created designated Provider Care Teams.  These Care Teams include your primary Cardiologist (physician) and Advanced Practice Providers (APPs -  Physician Assistants and Nurse Practitioners) who all work together to provide you with the care you need, when you need it. You will need a follow up appointment in 6 months.   

## 2018-11-28 NOTE — Progress Notes (Signed)
Cardiology Office Note:    Date:  11/28/2018   ID:  Erika Rice, DOB January 24, 1925, MRN [id number]  PCP:  Lise AuerKhan, Jaber A, MD  Cardiologist:  Garwin Brothersajan R Ahria Slappey, MD   Referring MD: Lise AuerKhan, Jaber A, MD    ASSESSMENT:    1. SSS (sick sinus syndrome) (HCC)   2. Bradycardia   3. Presence of permanent cardiac pacemaker    PLAN:    In order of problems listed above:  1. Sick sinus syndrome: Severe bradycardia: Patient has a history of pacemaker therapy and has a permanent pacemaker which is functioning normally.  Her blood pressure stable.  I discussed this with her and questions were answered to her and her son satisfaction.  Hospital records were reviewed extensively.  She will be seen in pacemaker followed by a pacemaker electrophysiology colleagues 2. Patient will be seen in follow-up appointment in 3 months or earlier if the patient has any concerns    Medication Adjustments/Labs and Tests Ordered: Current medicines are reviewed at length with the patient today.  Concerns regarding medicines are outlined above.  No orders of the defined types were placed in this encounter.  No orders of the defined types were placed in this encounter.    Chief Complaint  Patient presents with  . Follow-up     History of Present Illness:    Erika Rice is a 85 y.o. female.  Patient was evaluated by me.  Patient has sick sinus syndrome and severe bradycardia and underwent pacemaker implantation.  Initially it was attempted to be done on the left side and patient had a complication of hemoptysis and pneumothorax.  Subsequently patient received pacemaker on the right and was discharged home and is doing fine.  She tells me that she does not feel much of a difference.  No chest pain orthopnea or PND.  She is brought in by her son who lives with her and is very supportive.  At the time of my evaluation, the patient is alert awake oriented and in no distress.  Past Medical History:  Diagnosis Date  .  Atrial fibrillation (HCC)   . Bradycardia   . COPD (chronic obstructive pulmonary disease) (HCC)   . Degenerative joint disease   . Dementia (HCC)   . Diabetes mellitus type 2, noninsulin dependent (HCC)   . Dyspnea   . GERD (gastroesophageal reflux disease)   . Hypertension   . Lymphopenia   . Right lower lobe pulmonary infiltrate   . Syncope   . UTI (urinary tract infection)   . Weakness     Past Surgical History:  Procedure Laterality Date  . CHOLECYSTECTOMY    . HYSTERECTOMY ABDOMINAL WITH SALPINGECTOMY    . PACEMAKER IMPLANT N/A 11/10/2018   Procedure: PACEMAKER IMPLANT;  Surgeon: Regan Lemmingamnitz, Will Martin, MD;  Location: [facility] INVASIVE CV LAB;  Service: Cardiovascular;  Laterality: N/A;  . PACEMAKER IMPLANT N/A 11/13/2018   Procedure: PACEMAKER IMPLANT;  Surgeon: Marinus Mawaylor, Gregg W, MD;  Location: [facility] INVASIVE CV LAB;  Service: Cardiovascular;  Laterality: N/A;  . TONSILLECTOMY      Current Medications: Current Meds  Medication Sig  . acetaminophen (TYLENOL) 500 MG tablet Take 500 mg by mouth every 6 (six) hours as needed (pain.).  Marland Kitchen. albuterol (PROVENTIL) (2.5 MG/3ML) 0.083% nebulizer solution Inhale 2.5 mg into the lungs every 6 (six) hours as needed for wheezing or shortness of breath.   . ALPRAZolam (XANAX) 0.25 MG tablet Take 0.25 mg by mouth daily as needed for anxiety.   .Marland Kitchen  amLODipine (NORVASC) 5 MG tablet Take 5 mg by mouth daily.  . celecoxib (CELEBREX) 200 MG capsule Take 200 mg by mouth daily at 12 noon.   . Cholecalciferol (VITAMIN D-3) 125 MCG (5000 UT) TABS Take 5,000 Units by mouth daily at 12 noon.  . donepezil (ARICEPT) 10 MG tablet Take 10 mg by mouth daily.  Marland Kitchen escitalopram (LEXAPRO) 20 MG tablet Take 20 mg by mouth daily.   . famotidine (PEPCID) 20 MG tablet Take 20 mg by mouth at bedtime.  . fentaNYL (DURAGESIC) 25 MCG/HR Place 1 patch onto the skin every 3 (three) days.   Marland Kitchen gabapentin (NEURONTIN) 800 MG tablet Take 800 mg by mouth 3 (three) times daily.  .  meclizine (ANTIVERT) 12.5 MG tablet Take 12.5 mg by mouth every 6 (six) hours as needed for dizziness.   . memantine (NAMENDA) 10 MG tablet Take 10 mg by mouth 2 (two) times daily.  . metoprolol succinate (TOPROL-XL) 25 MG 24 hr tablet Take 1 tablet (25 mg total) by mouth daily.  Marland Kitchen oxyCODONE-acetaminophen (PERCOCET) 7.5-325 MG tablet Take 1 tablet by mouth every 8 (eight) hours as needed for moderate pain.   . pantoprazole (PROTONIX) 40 MG tablet Take 40 mg by mouth daily before breakfast.   . Polyethyl Glycol-Propyl Glycol (LUBRICANT EYE DROPS) 0.4-0.3 % SOLN Place 1 drop into both eyes 3 (three) times daily as needed (dry/irritated eyes.).  Marland Kitchen polyvinyl alcohol (LIQUIFILM TEARS) 1.4 % ophthalmic solution Place 1 drop into both eyes as needed for dry eyes.  Marland Kitchen risperiDONE (RISPERDAL) 0.25 MG tablet Take 0.5 mg by mouth at bedtime.   . vitamin B-12 (CYANOCOBALAMIN) 1000 MCG tablet Take 1,000 mcg by mouth daily at 12 noon.      Allergies:   Sulfamethoxazole and Prednisone   Social History   Socioeconomic History  . Marital status: Widowed    Spouse name: Not on file  . Number of children: Not on file  . Years of education: Not on file  . Highest education level: Not on file  Occupational History  . Not on file  Social Needs  . Financial resource strain: Not on file  . Food insecurity    Worry: Not on file    Inability: Not on file  . Transportation needs    Medical: No    Non-medical: No  Tobacco Use  . Smoking status: Never Smoker  . Smokeless tobacco: Never Used  Substance and Sexual Activity  . Alcohol use: Never    Frequency: Never  . Drug use: Never  . Sexual activity: Not Currently  Lifestyle  . Physical activity    Days per week: Patient refused    Minutes per session: Patient refused  . Stress: Only a little  Relationships  . Social Musician on phone: Patient refused    Gets together: Patient refused    Attends religious service: Patient refused     Active member of club or organization: Patient refused    Attends meetings of clubs or organizations: Patient refused    Relationship status: Patient refused  Other Topics Concern  . Not on file  Social History Narrative  . Not on file     Family History: The patient's family history includes Cancer in her brother; Diabetes in her mother; Hypertension in her father and mother.  ROS:   Please see the history of present illness.    All other systems reviewed and are negative.  EKGs/Labs/Other Studies Reviewed:  The following studies were reviewed today: Atrial and ventricular paced rhythm   Recent Labs: 07/07/2018: B Natriuretic Peptide 281.5 07/08/2018: Magnesium 1.8 07/09/2018: ALT 11 11/07/2018: Hemoglobin 13.4; Platelets 209 11/10/2018: BUN 17; Creatinine, Ser 1.06; Potassium 4.3; Sodium 141  Recent Lipid Panel No results found for: CHOL, TRIG, HDL, CHOLHDL, VLDL, LDLCALC, LDLDIRECT  Physical Exam:    VS:  BP 120/78 (BP Location: Right Arm, Patient Position: Sitting, Cuff Size: Normal)   Pulse (!) 50   Ht 5\' 3"  (1.6 m)   Wt 162 lb (73.5 kg)   SpO2 91%   BMI 28.70 kg/m     Wt Readings from Last 3 Encounters:  11/28/18 162 lb (73.5 kg)  11/15/18 158 lb 15.2 oz (72.1 kg)  11/07/18 166 lb (75.3 kg)     GEN: Patient is in no acute distress HEENT: Normal NECK: No JVD; No carotid bruits LYMPHATICS: No lymphadenopathy CARDIAC: Hear sounds regular, 2/6 systolic murmur at the apex. RESPIRATORY:  Clear to auscultation without rales, wheezing or rhonchi  ABDOMEN: Soft, non-tender, non-distended MUSCULOSKELETAL:  No edema; No deformity  SKIN: Warm and dry NEUROLOGIC:  Alert and oriented x 3 PSYCHIATRIC:  Normal affect   Signed, Jenean Lindau, MD  11/28/2018 1:45 PM    [facility]

## 2018-11-30 DIAGNOSIS — M1990 Unspecified osteoarthritis, unspecified site: Secondary | ICD-10-CM | POA: Diagnosis not present

## 2018-11-30 DIAGNOSIS — E119 Type 2 diabetes mellitus without complications: Secondary | ICD-10-CM | POA: Diagnosis not present

## 2018-11-30 DIAGNOSIS — I4891 Unspecified atrial fibrillation: Secondary | ICD-10-CM | POA: Diagnosis not present

## 2018-11-30 DIAGNOSIS — Z48812 Encounter for surgical aftercare following surgery on the circulatory system: Secondary | ICD-10-CM | POA: Diagnosis not present

## 2018-11-30 DIAGNOSIS — I10 Essential (primary) hypertension: Secondary | ICD-10-CM | POA: Diagnosis not present

## 2018-12-04 DIAGNOSIS — I10 Essential (primary) hypertension: Secondary | ICD-10-CM | POA: Diagnosis not present

## 2018-12-04 DIAGNOSIS — E119 Type 2 diabetes mellitus without complications: Secondary | ICD-10-CM | POA: Diagnosis not present

## 2018-12-04 DIAGNOSIS — I4891 Unspecified atrial fibrillation: Secondary | ICD-10-CM | POA: Diagnosis not present

## 2018-12-04 DIAGNOSIS — M1990 Unspecified osteoarthritis, unspecified site: Secondary | ICD-10-CM | POA: Diagnosis not present

## 2018-12-04 DIAGNOSIS — Z48812 Encounter for surgical aftercare following surgery on the circulatory system: Secondary | ICD-10-CM | POA: Diagnosis not present

## 2018-12-24 DIAGNOSIS — R0902 Hypoxemia: Secondary | ICD-10-CM | POA: Diagnosis not present

## 2018-12-26 DIAGNOSIS — G8929 Other chronic pain: Secondary | ICD-10-CM | POA: Diagnosis not present

## 2018-12-26 DIAGNOSIS — Z23 Encounter for immunization: Secondary | ICD-10-CM | POA: Diagnosis not present

## 2018-12-26 DIAGNOSIS — R52 Pain, unspecified: Secondary | ICD-10-CM | POA: Diagnosis not present

## 2018-12-27 DIAGNOSIS — R52 Pain, unspecified: Secondary | ICD-10-CM | POA: Diagnosis not present

## 2018-12-27 DIAGNOSIS — M4800 Spinal stenosis, site unspecified: Secondary | ICD-10-CM | POA: Diagnosis not present

## 2018-12-27 DIAGNOSIS — E785 Hyperlipidemia, unspecified: Secondary | ICD-10-CM | POA: Diagnosis not present

## 2018-12-29 ENCOUNTER — Other Ambulatory Visit: Payer: Medicare Other

## 2019-01-08 DIAGNOSIS — R0902 Hypoxemia: Secondary | ICD-10-CM | POA: Diagnosis not present

## 2019-01-08 DIAGNOSIS — J189 Pneumonia, unspecified organism: Secondary | ICD-10-CM | POA: Diagnosis not present

## 2019-01-08 DIAGNOSIS — R531 Weakness: Secondary | ICD-10-CM | POA: Diagnosis not present

## 2019-01-08 DIAGNOSIS — R410 Disorientation, unspecified: Secondary | ICD-10-CM | POA: Diagnosis not present

## 2019-01-08 DIAGNOSIS — Z03818 Encounter for observation for suspected exposure to other biological agents ruled out: Secondary | ICD-10-CM | POA: Diagnosis not present

## 2019-01-08 DIAGNOSIS — R918 Other nonspecific abnormal finding of lung field: Secondary | ICD-10-CM | POA: Diagnosis not present

## 2019-01-08 DIAGNOSIS — R0602 Shortness of breath: Secondary | ICD-10-CM | POA: Diagnosis not present

## 2019-01-08 DIAGNOSIS — Z743 Need for continuous supervision: Secondary | ICD-10-CM | POA: Diagnosis not present

## 2019-01-08 DIAGNOSIS — R05 Cough: Secondary | ICD-10-CM | POA: Diagnosis not present

## 2019-01-16 DIAGNOSIS — Z950 Presence of cardiac pacemaker: Secondary | ICD-10-CM | POA: Diagnosis not present

## 2019-01-16 DIAGNOSIS — Z87440 Personal history of urinary (tract) infections: Secondary | ICD-10-CM | POA: Diagnosis not present

## 2019-01-23 DIAGNOSIS — R0902 Hypoxemia: Secondary | ICD-10-CM | POA: Diagnosis not present

## 2019-02-13 ENCOUNTER — Ambulatory Visit (INDEPENDENT_AMBULATORY_CARE_PROVIDER_SITE_OTHER): Payer: Medicare Other | Admitting: *Deleted

## 2019-02-13 DIAGNOSIS — I495 Sick sinus syndrome: Secondary | ICD-10-CM

## 2019-02-14 LAB — CUP PACEART REMOTE DEVICE CHECK
Battery Remaining Longevity: 85 mo
Battery Remaining Percentage: 95.5 %
Battery Voltage: 2.99 V
Brady Statistic AP VP Percent: 88 %
Brady Statistic AP VS Percent: 8.8 %
Brady Statistic AS VP Percent: 1 %
Brady Statistic AS VS Percent: 1.9 %
Brady Statistic RA Percent Paced: 96 %
Brady Statistic RV Percent Paced: 89 %
Date Time Interrogation Session: 20201117070014
Implantable Lead Implant Date: 20200817
Implantable Lead Implant Date: 20200817
Implantable Lead Location: 753859
Implantable Lead Location: 753860
Implantable Pulse Generator Implant Date: 20200817
Lead Channel Impedance Value: 430 Ohm
Lead Channel Impedance Value: 510 Ohm
Lead Channel Pacing Threshold Amplitude: 0.625 V
Lead Channel Pacing Threshold Amplitude: 0.75 V
Lead Channel Pacing Threshold Pulse Width: 0.5 ms
Lead Channel Pacing Threshold Pulse Width: 0.5 ms
Lead Channel Sensing Intrinsic Amplitude: 0.6 mV
Lead Channel Sensing Intrinsic Amplitude: 8.5 mV
Lead Channel Setting Pacing Amplitude: 0.875
Lead Channel Setting Pacing Amplitude: 3.5 V
Lead Channel Setting Pacing Pulse Width: 0.5 ms
Lead Channel Setting Sensing Sensitivity: 2 mV
Pulse Gen Model: 2272
Pulse Gen Serial Number: 9151060

## 2019-02-19 ENCOUNTER — Encounter: Payer: Medicare Other | Admitting: Cardiology

## 2019-03-12 NOTE — Addendum Note (Signed)
Addended by: Douglass Rivers D on: 03/12/2019 03:48 PM   Modules accepted: Level of Service

## 2019-03-12 NOTE — Progress Notes (Signed)
Remote pacemaker transmission.   

## 2019-04-30 ENCOUNTER — Encounter: Payer: Medicare Other | Admitting: Cardiology

## 2019-05-15 ENCOUNTER — Ambulatory Visit (INDEPENDENT_AMBULATORY_CARE_PROVIDER_SITE_OTHER): Payer: Medicare Other | Admitting: *Deleted

## 2019-05-15 DIAGNOSIS — I495 Sick sinus syndrome: Secondary | ICD-10-CM

## 2019-05-15 LAB — CUP PACEART REMOTE DEVICE CHECK
Battery Remaining Longevity: 82 mo
Battery Remaining Percentage: 95.5 %
Battery Voltage: 2.99 V
Brady Statistic AP VP Percent: 77 %
Brady Statistic AP VS Percent: 17 %
Brady Statistic AS VP Percent: 1 %
Brady Statistic AS VS Percent: 5.3 %
Brady Statistic RA Percent Paced: 93 %
Brady Statistic RV Percent Paced: 78 %
Date Time Interrogation Session: 20210216020013
Implantable Lead Implant Date: 20200817
Implantable Lead Implant Date: 20200817
Implantable Lead Location: 753859
Implantable Lead Location: 753860
Implantable Pulse Generator Implant Date: 20200817
Lead Channel Impedance Value: 400 Ohm
Lead Channel Impedance Value: 490 Ohm
Lead Channel Pacing Threshold Amplitude: 0.625 V
Lead Channel Pacing Threshold Amplitude: 0.75 V
Lead Channel Pacing Threshold Pulse Width: 0.5 ms
Lead Channel Pacing Threshold Pulse Width: 0.5 ms
Lead Channel Sensing Intrinsic Amplitude: 0.8 mV
Lead Channel Sensing Intrinsic Amplitude: 9.7 mV
Lead Channel Setting Pacing Amplitude: 0.875
Lead Channel Setting Pacing Amplitude: 3.5 V
Lead Channel Setting Pacing Pulse Width: 0.5 ms
Lead Channel Setting Sensing Sensitivity: 2 mV
Pulse Gen Model: 2272
Pulse Gen Serial Number: 9151060

## 2019-05-16 NOTE — Progress Notes (Signed)
PPM Remote  

## 2019-06-01 ENCOUNTER — Ambulatory Visit: Payer: Medicare Other | Admitting: Cardiology

## 2019-06-01 ENCOUNTER — Encounter: Payer: Self-pay | Admitting: Cardiology

## 2019-06-01 ENCOUNTER — Encounter (INDEPENDENT_AMBULATORY_CARE_PROVIDER_SITE_OTHER): Payer: Self-pay

## 2019-06-01 ENCOUNTER — Other Ambulatory Visit: Payer: Self-pay

## 2019-06-01 VITALS — BP 110/60 | HR 56 | Ht 63.0 in

## 2019-06-01 DIAGNOSIS — I495 Sick sinus syndrome: Secondary | ICD-10-CM | POA: Diagnosis not present

## 2019-06-01 DIAGNOSIS — Z950 Presence of cardiac pacemaker: Secondary | ICD-10-CM | POA: Diagnosis not present

## 2019-06-01 NOTE — Patient Instructions (Signed)
Medication Instructions:  No medication changes *If you need a refill on your cardiac medications before your next appointment, please call your pharmacy*   Lab Work: You had a BMET. If you have labs (blood work) drawn today and your tests are completely normal, you will receive your results only by: Marland Kitchen MyChart Message (if you have MyChart) OR . A paper copy in the mail If you have any lab test that is abnormal or we need to change your treatment, we will call you to review the results.   Testing/Procedures: None ordered   Follow-Up: At [facility], you and your health needs are our priority.  As part of our continuing mission to provide you with exceptional heart care, we have created designated Provider Care Teams.  These Care Teams include your primary Cardiologist (physician) and Advanced Practice Providers (APPs -  Physician Assistants and Nurse Practitioners) who all work together to provide you with the care you need, when you need it.  We recommend signing up for the patient portal called "MyChart".  Sign up information is provided on this After Visit Summary.  MyChart is used to connect with patients for Virtual Visits (Telemedicine).  Patients are able to view lab/test results, encounter notes, upcoming appointments, etc.  Non-urgent messages can be sent to your provider as well.   To learn more about what you can do with MyChart, go to [email or web address].    Your next appointment:   6 month(s)  The format for your next appointment:   In Person  Provider:   Belva Crome, MD   Other Instructions NA

## 2019-06-01 NOTE — Progress Notes (Signed)
Cardiology Office Note:    Date:  06/01/2019   ID:  Erika Rice, DOB Sep 29, 1924, MRN [id number]  PCP:  Mateo Flow, MD  Cardiologist:  Jenean Lindau, MD   Referring MD: Mateo Flow, MD    ASSESSMENT:    1. SSS (sick sinus syndrome) ([place])   2. Presence of permanent cardiac pacemaker    PLAN:    In order of problems listed above:  1. Primary prevention stressed with the patient.  Importance of compliance with diet and medication stressed and she vocalized understanding. 2. Sick sinus syndrome: Stable at this time. 3. Permanent pacing: Patient's pacemaker function appears to be fine.  Recent evaluations by electrophysiologist colleagues were reviewed and discussed with her. 4. She is on multiple medications and therefore we will do a Chem-7 today. 5. Patient will be seen in follow-up appointment in 6 months or earlier if the patient has any concerns    Medication Adjustments/Labs and Tests Ordered: Current medicines are reviewed at length with the patient today.  Concerns regarding medicines are outlined above.  No orders of the defined types were placed in this encounter.  No orders of the defined types were placed in this encounter.    Chief Complaint  Patient presents with  . Follow-up     History of Present Illness:    Erika Rice is a 86 y.o. female.  Patient has past medical history of sick sinus syndrome and has a permanent pacemaker.  She denies any problems at this time and takes care of activities of daily living.  No chest pain orthopnea or PND.  She is brought in by her son with the wheelchair and he is very supportive.  Patient is in no distress.  She is alert and oriented.  Past Medical History:  Diagnosis Date  . Atrial fibrillation ([place])   . Bradycardia   . COPD (chronic obstructive pulmonary disease) ([place])   . Degenerative joint disease   . Dementia ([place])   . Diabetes mellitus type 2, noninsulin dependent ([place])   . Dyspnea   . GERD  (gastroesophageal reflux disease)   . Hypertension   . Lymphopenia   . Right lower lobe pulmonary infiltrate   . Syncope   . UTI (urinary tract infection)   . Weakness     Past Surgical History:  Procedure Laterality Date  . CHOLECYSTECTOMY    . HYSTERECTOMY ABDOMINAL WITH SALPINGECTOMY    . PACEMAKER IMPLANT N/A 11/10/2018   Procedure: PACEMAKER IMPLANT;  Surgeon: Constance Haw, MD;  Location: [facility];  Service: Cardiovascular;  Laterality: N/A;  . PACEMAKER IMPLANT N/A 11/13/2018   Procedure: PACEMAKER IMPLANT;  Surgeon: Evans Lance, MD;  Location: [place] [facility];  Service: Cardiovascular;  Laterality: N/A;  . TONSILLECTOMY      Current Medications: Current Meds  Medication Sig  . acetaminophen (TYLENOL) 500 MG tablet Take 500 mg by mouth every 6 (six) hours as needed (pain.).  Marland Kitchen albuterol (PROVENTIL) (2.5 MG/3ML) 0.083% nebulizer solution Inhale 2.5 mg into the lungs every 6 (six) hours as needed for wheezing or shortness of breath.   . ALPRAZolam (XANAX) 0.25 MG tablet Take 0.25 mg by mouth daily as needed for anxiety.   Marland Kitchen amLODipine (NORVASC) 5 MG tablet Take 5 mg by mouth daily.  Marland Kitchen buPROPion (WELLBUTRIN XL) 150 MG 24 hr tablet Take 150 mg by mouth every morning.  . celecoxib (CELEBREX) 200 MG capsule Take 200 mg by mouth daily at 12  noon.   . Cholecalciferol (VITAMIN D-3) 125 MCG (5000 UT) TABS Take 5,000 Units by mouth daily at 12 noon.  . donepezil (ARICEPT) 10 MG tablet Take 10 mg by mouth daily.  Marland Kitchen escitalopram (LEXAPRO) 20 MG tablet Take 20 mg by mouth daily.   . famotidine (PEPCID) 20 MG tablet Take 20 mg by mouth at bedtime.  . fentaNYL (DURAGESIC) 25 MCG/HR Place 1 patch onto the skin every 3 (three) days.   Marland Kitchen gabapentin (NEURONTIN) 800 MG tablet Take 800 mg by mouth 3 (three) times daily.  . meclizine (ANTIVERT) 12.5 MG tablet Take 12.5 mg by mouth every 6 (six) hours as needed for dizziness.   . memantine (NAMENDA) 10 MG tablet Take 10 mg by  mouth 2 (two) times daily.  . metoprolol succinate (TOPROL-XL) 25 MG 24 hr tablet Take 1 tablet (25 mg total) by mouth daily.  Marland Kitchen oxyCODONE-acetaminophen (PERCOCET) 7.5-325 MG tablet Take 1 tablet by mouth every 8 (eight) hours as needed for moderate pain.   . pantoprazole (PROTONIX) 40 MG tablet Take 40 mg by mouth daily before breakfast.   . Polyethyl Glycol-Propyl Glycol (LUBRICANT EYE DROPS) 0.4-0.3 % SOLN Place 1 drop into both eyes 3 (three) times daily as needed (dry/irritated eyes.).  Marland Kitchen polyvinyl alcohol (LIQUIFILM TEARS) 1.4 % ophthalmic solution Place 1 drop into both eyes as needed for dry eyes.  Marland Kitchen risperiDONE (RISPERDAL) 0.25 MG tablet Take 0.5 mg by mouth at bedtime.   . vitamin B-12 (CYANOCOBALAMIN) 1000 MCG tablet Take 1,000 mcg by mouth daily at 12 noon.      Allergies:   Sulfamethoxazole and Prednisone   Social History   Socioeconomic History  . Marital status: Widowed    Spouse name: Not on file  . Number of children: Not on file  . Years of education: Not on file  . Highest education level: Not on file  Occupational History  . Not on file  Tobacco Use  . Smoking status: Never Smoker  . Smokeless tobacco: Never Used  Substance and Sexual Activity  . Alcohol use: Never  . Drug use: Never  . Sexual activity: Not Currently  Other Topics Concern  . Not on file  Social History Narrative  . Not on file   Social Determinants of Health   Financial Resource Strain:   . Difficulty of Paying Living Expenses: Not on file  Food Insecurity:   . Worried About Programme researcher, broadcasting/film/video in the Last Year: Not on file  . Ran Out of Food in the Last Year: Not on file  Transportation Needs: No Transportation Needs  . Lack of Transportation (Medical): No  . Lack of Transportation (Non-Medical): No  Physical Activity: Unknown  . Days of Exercise per Week: Patient refused  . Minutes of Exercise per Session: Patient refused  Stress: No Stress Concern Present  . Feeling of Stress :  Only a little  Social Connections: Unknown  . Frequency of Communication with Friends and Family: Patient refused  . Frequency of Social Gatherings with Friends and Family: Patient refused  . Attends Religious Services: Patient refused  . Active Member of Clubs or Organizations: Patient refused  . Attends Banker Meetings: Patient refused  . Marital Status: Patient refused     Family History: The patient's family history includes Cancer in her brother; Diabetes in her mother; Hypertension in her father and mother.  ROS:   Please see the history of present illness.    All other systems reviewed  and are negative.  EKGs/Labs/Other Studies Reviewed:    The following studies were reviewed today: I reviewed records including pacemaker evaluation notes by electrophysiology colleagues.   Recent Labs: 07/07/2018: B Natriuretic Peptide 281.5 07/08/2018: Magnesium 1.8 07/09/2018: ALT 11 11/07/2018: Hemoglobin 13.4; Platelets 209 11/10/2018: BUN 17; Creatinine, Ser 1.06; Potassium 4.3; Sodium 141  Recent Lipid Panel No results found for: CHOL, TRIG, HDL, CHOLHDL, VLDL, LDLCALC, LDLDIRECT  Physical Exam:    VS:  Ht 5\' 3"  (1.6 m)   BMI 28.70 kg/m     Wt Readings from Last 3 Encounters:  11/28/18 162 lb (73.5 kg)  11/15/18 158 lb 15.2 oz (72.1 kg)  11/07/18 166 lb (75.3 kg)     GEN: Patient is in no acute distress HEENT: Normal NECK: No JVD; No carotid bruits LYMPHATICS: No lymphadenopathy CARDIAC: Hear sounds regular, 2/6 systolic murmur at the apex. RESPIRATORY:  Clear to auscultation without rales, wheezing or rhonchi  ABDOMEN: Soft, non-tender, non-distended MUSCULOSKELETAL:  No edema; No deformity  SKIN: Warm and dry NEUROLOGIC:  Alert and oriented x 3 PSYCHIATRIC:  Normal affect   Signed, 01/07/19, MD  06/01/2019 4:19 PM     [facility]

## 2019-06-01 NOTE — Addendum Note (Signed)
Addended by: Eleonore Chiquito on: 06/01/2019 04:36 PM   Modules accepted: Orders

## 2019-06-02 LAB — BASIC METABOLIC PANEL
BUN/Creatinine Ratio: 20 (ref 12–28)
BUN: 16 mg/dL (ref 10–36)
CO2: 37 mmol/L — ABNORMAL HIGH (ref 20–29)
Calcium: 9.7 mg/dL (ref 8.7–10.3)
Chloride: 95 mmol/L — ABNORMAL LOW (ref 96–106)
Creatinine, Ser: 0.79 mg/dL (ref 0.57–1.00)
GFR calc Af Amer: 75 mL/min/{1.73_m2} (ref >59)
GFR calc non Af Amer: 65 mL/min/{1.73_m2} (ref >59)
Glucose: 100 mg/dL — ABNORMAL HIGH (ref 65–99)
Potassium: 4.9 mmol/L (ref 3.5–5.2)
Sodium: 143 mmol/L (ref 134–144)

## 2019-07-16 ENCOUNTER — Other Ambulatory Visit: Payer: Self-pay

## 2019-07-16 ENCOUNTER — Ambulatory Visit (INDEPENDENT_AMBULATORY_CARE_PROVIDER_SITE_OTHER): Admitting: Cardiology

## 2019-07-16 ENCOUNTER — Encounter: Payer: Self-pay | Admitting: Cardiology

## 2019-07-16 VITALS — BP 142/62 | HR 50 | Ht 63.0 in

## 2019-07-16 DIAGNOSIS — Z950 Presence of cardiac pacemaker: Secondary | ICD-10-CM

## 2019-07-16 DIAGNOSIS — I495 Sick sinus syndrome: Secondary | ICD-10-CM

## 2019-07-16 LAB — CUP PACEART INCLINIC DEVICE CHECK
Battery Remaining Longevity: 114 mo
Battery Voltage: 2.99 V
Brady Statistic RA Percent Paced: 92 %
Brady Statistic RV Percent Paced: 70 %
Date Time Interrogation Session: 20210419144725
Implantable Lead Implant Date: 20200817
Implantable Lead Implant Date: 20200817
Implantable Lead Location: 753859
Implantable Lead Location: 753860
Implantable Pulse Generator Implant Date: 20200817
Lead Channel Impedance Value: 412.5 Ohm
Lead Channel Impedance Value: 525 Ohm
Lead Channel Pacing Threshold Amplitude: 0.625 V
Lead Channel Pacing Threshold Amplitude: 1 V
Lead Channel Pacing Threshold Pulse Width: 0.5 ms
Lead Channel Pacing Threshold Pulse Width: 0.5 ms
Lead Channel Sensing Intrinsic Amplitude: 0.3 mV
Lead Channel Sensing Intrinsic Amplitude: 9.4 mV
Lead Channel Setting Pacing Amplitude: 0.875
Lead Channel Setting Pacing Amplitude: 2 V
Lead Channel Setting Pacing Pulse Width: 0.5 ms
Lead Channel Setting Sensing Sensitivity: 2 mV
Pulse Gen Model: 2272
Pulse Gen Serial Number: 9151060

## 2019-07-16 MED ORDER — AMLODIPINE BESYLATE 10 MG PO TABS: 10.0000 mg | ORAL_TABLET | Freq: Every day | ORAL | 3 refills | Status: AC

## 2019-07-16 NOTE — Patient Instructions (Addendum)
Medication Instructions:  Your physician has recommended you make the following change in your medication:  1. STOP Metoprolol Succinate (Toprol) 2. INCREASE Amlodipine (Norvasc) to 10 mg once daily  *If you need a refill on your cardiac medications before your next appointment, please call your pharmacy*   Lab Work: None ordered If you have labs (blood work) drawn today and your tests are completely normal, you will receive your results only by: Marland Kitchen MyChart Message (if you have MyChart) OR . A paper copy in the mail If you have any lab test that is abnormal or we need to change your treatment, we will call you to review the results.   Testing/Procedures: None ordered   Follow-Up: Remote monitoring is used to monitor your Pacemaker of ICD from home. This monitoring reduces the number of office visits required to check your device to one time per year. It allows [place] to keep an eye on the functioning of your device to ensure it is working properly. You are scheduled for a device check from home on 08/14/2019. You may send your transmission at any time that day. If you have a wireless device, the transmission will be sent automatically. After your physician reviews your transmission, you will receive a postcard with your next transmission date.  At [facility], you and your health needs are our priority.  As part of our continuing mission to provide you with exceptional heart care, we have created designated Provider Care Teams.  These Care Teams include your primary Cardiologist (physician) and Advanced Practice Providers (APPs -  Physician Assistants and Nurse Practitioners) who all work together to provide you with the care you need, when you need it.  We recommend signing up for the patient portal called "MyChart".  Sign up information is provided on this After Visit Summary.  MyChart is used to connect with patients for Virtual Visits (Telemedicine).  Patients are able to view lab/test  results, encounter notes, upcoming appointments, etc.  Non-urgent messages can be sent to your provider as well.   To learn more about what you can do with MyChart, go to [email or web address].    Your next appointment:   1 year(s)  The format for your next appointment:   In Person  Provider:   Loman Brooklyn, MD   Thank you for choosing [facility]!!   Dory Horn, RN [phone]    Other Instructions

## 2019-07-16 NOTE — Progress Notes (Signed)
Electrophysiology Office Note   Date:  07/17/2019   ID:  Erika Rice, DOB 02/02/1925, MRN [id number]  PCP:  Lise Auer, MD  Cardiologist: Revankar Primary Electrophysiologist:  Goku Harb Jorja Loa, MD    No chief complaint on file.    History of Present Illness: Erika Rice is a 86 y.o. female who is being seen today for the evaluation of bradycardia at the request of Glean Hess on Revankar. Presenting today for electrophysiology evaluation.  She has a history significant for atrial fibrillation, COPD, type 2 diabetes, hypertension, and dementia.  Bradycardic and had a cardiac monitor placed.  Monitor shows episodes of bradycardia and thus she was referred.  She has been weak and fatigued for the past few months.  She wore a cardiac monitor that showed sinus rhythm with sinus arrest and pauses up to 4-1/2 seconds.  Most of her pauses were nocturnal, but she does come into clinic this morning with heart rates in the 30s complaining of weakness and fatigue.  She is now status post Saint Jude dual-chamber pacemaker implanted 11/13/2018.   Today, denies symptoms of palpitations, chest pain, shortness of breath, orthopnea, PND, lower extremity edema, claudication, dizziness, presyncope, syncope, bleeding, or neurologic sequela. The patient is tolerating medications without difficulties.  She continues to feel weak and fatigued.  She has no energy to even get out of bed in the mornings.  She is not able to do much of her daily activities as she is restricted to a wheelchair.  She is quite tired today in clinic, closing her eyes at times.  She seems very frustrated with her overall situation.   Past Medical History:  Diagnosis Date  . Atrial fibrillation ([facility])   . Bradycardia   . COPD (chronic obstructive pulmonary disease) ([facility])   . Degenerative joint disease   . Dementia ([facility])   . Diabetes mellitus type 2, noninsulin dependent ([facility])   . Dyspnea   . GERD (gastroesophageal reflux disease)     . Hypertension   . Lymphopenia   . Right lower lobe pulmonary infiltrate   . Syncope   . UTI (urinary tract infection)   . Weakness    Past Surgical History:  Procedure Laterality Date  . CHOLECYSTECTOMY    . HYSTERECTOMY ABDOMINAL WITH SALPINGECTOMY    . PACEMAKER IMPLANT N/A 11/10/2018   Procedure: PACEMAKER IMPLANT;  Surgeon: Regan Lemming, MD;  Location: [facility];  Service: Cardiovascular;  Laterality: N/A;  . PACEMAKER IMPLANT N/A 11/13/2018   Procedure: PACEMAKER IMPLANT;  Surgeon: Marinus Maw, MD;  Location: [facility];  Service: Cardiovascular;  Laterality: N/A;  . TONSILLECTOMY       Current Outpatient Medications  Medication Sig Dispense Refill  . acetaminophen (TYLENOL) 500 MG tablet Take 500 mg by mouth every 6 (six) hours as needed (pain.).    Marland Kitchen albuterol (PROVENTIL) (2.5 MG/3ML) 0.083% nebulizer solution Inhale 2.5 mg into the lungs every 6 (six) hours as needed for wheezing or shortness of breath.     . ALLERGY RELIEF 10 MG tablet Take 10 mg by mouth daily.    Marland Kitchen ALPRAZolam (XANAX) 0.25 MG tablet Take 0.25 mg by mouth daily as needed for anxiety.     Marland Kitchen buPROPion (WELLBUTRIN XL) 150 MG 24 hr tablet Take 150 mg by mouth every morning.    . celecoxib (CELEBREX) 200 MG capsule Take 200 mg by mouth daily at 12 noon.     . Cholecalciferol (VITAMIN D-3) 125 MCG (5000  UT) TABS Take 5,000 Units by mouth daily at 12 noon.    . donepezil (ARICEPT) 10 MG tablet Take 10 mg by mouth daily.    Marland Kitchen escitalopram (LEXAPRO) 20 MG tablet Take 20 mg by mouth daily.     . famotidine (PEPCID) 20 MG tablet Take 20 mg by mouth at bedtime.    . fentaNYL (DURAGESIC) 25 MCG/HR Place 1 patch onto the skin every 3 (three) days.     Marland Kitchen gabapentin (NEURONTIN) 800 MG tablet Take 800 mg by mouth 3 (three) times daily.    . meclizine (ANTIVERT) 12.5 MG tablet Take 12.5 mg by mouth every 6 (six) hours as needed for dizziness.     . memantine (NAMENDA) 10 MG tablet Take 10 mg by  mouth 2 (two) times daily.    Marland Kitchen oxyCODONE-acetaminophen (PERCOCET) 7.5-325 MG tablet Take 1 tablet by mouth every 8 (eight) hours as needed for moderate pain.     . pantoprazole (PROTONIX) 40 MG tablet Take 40 mg by mouth daily before breakfast.     . Polyethyl Glycol-Propyl Glycol (LUBRICANT EYE DROPS) 0.4-0.3 % SOLN Place 1 drop into both eyes 3 (three) times daily as needed (dry/irritated eyes.).    Marland Kitchen polyvinyl alcohol (LIQUIFILM TEARS) 1.4 % ophthalmic solution Place 1 drop into both eyes as needed for dry eyes. 15 mL 0  . risperiDONE (RISPERDAL) 0.25 MG tablet Take 0.5 mg by mouth at bedtime.     . vitamin B-12 (CYANOCOBALAMIN) 1000 MCG tablet Take 1,000 mcg by mouth daily at 12 noon.     Marland Kitchen amLODipine (NORVASC) 10 MG tablet Take 1 tablet (10 mg total) by mouth daily. 180 tablet 3   No current facility-administered medications for this visit.    Allergies:   Sulfamethoxazole and Prednisone   Social History:  The patient  reports that she has never smoked. She has never used smokeless tobacco. She reports that she does not drink alcohol or use drugs.   Family History:  The patient's family history includes Cancer in her brother; Diabetes in her mother; Hypertension in her father and mother.    ROS:  Please see the history of present illness.   Otherwise, review of systems is positive for none.   All other systems are reviewed and negative.   PHYSICAL EXAM: VS:  BP (!) 142/62   Pulse (!) 50   Ht 5\' 3"  (1.6 m)   SpO2 99%   BMI 28.70 kg/m  , BMI Body mass index is 28.7 kg/m. GEN: Well nourished, well developed, in no acute distress  HEENT: normal  Neck: no JVD, carotid bruits, or masses Cardiac: RRR; no murmurs, rubs, or gallops,no edema  Respiratory:  clear to auscultation bilaterally, normal work of breathing GI: soft, nontender, nondistended, + BS MS: no deformity or atrophy  Skin: warm and dry, device site well healed Neuro:  Strength and sensation are intact Psych: euthymic  mood, full affect  EKG:  EKG is ordered today. Personal review of the ekg ordered shows A paced, RBBB  Personal review of the device interrogation today. Results in [place]: 11/07/2018: Hemoglobin 13.4; Platelets 209 06/01/2019: BUN 16; Creatinine, Ser 0.79; Potassium 4.9; Sodium 143    Lipid Panel  No results found for: CHOL, TRIG, HDL, CHOLHDL, VLDL, LDLCALC, LDLDIRECT   Wt Readings from Last 3 Encounters:  11/28/18 162 lb (73.5 kg)  11/15/18 158 lb 15.2 oz (72.1 kg)  11/07/18 166 lb (75.3 kg)  Other studies Reviewed: Additional studies/ records that were reviewed today include: Cardiac monitor 10/10/2018 personally reviewed Review of the above records today demonstrates:  Baseline rhythm: Sinus Minimum heart rate: 23 BPM at 7:31 AM on 10/01/2018.  Average heart rate: 48 BPM.  Maximal heart rate 81 BPM. Atrial arrhythmia: Sinus rhythm rare brief atrial runs the longest was 8 beats Ventricular arrhythmia: None significant rare PVCs Conduction abnormality: Patient had multiple pauses.  87 pauses occurred the longest was 4.7 seconds.    ASSESSMENT AND PLAN:  1.  Sinus bradycardia with sinus arrest: Status post Saint Jude dual-chamber pacemaker implanted 11/13/2018.  Device functioning appropriately.  No changes at this time.  We Cristi Gwynn increase her basal pacing rate to 60.  She is continued to have weakness and fatigue.  Due to that we Saddie Sandeen stop metoprolol.  2.  Hypertension: Blood pressure is elevated today.  Damean Poffenberger increase Norvasc to 10 mg.  Current medicines are reviewed at length with the patient today.   The patient does not have concerns regarding her medicines.  The following changes were made today: Stop metoprolol, increase Norvasc  Labs/ tests ordered today include:  Orders Placed This Encounter  Procedures  . CUP PACEART INCLINIC DEVICE CHECK  . EKG 12-Lead     Disposition:   FU with Adonijah Baena 12 months  Signed, Brysyn Brandenberger Jorja Loa, MD    07/17/2019 8:04 AM     [facility] [address] [place] [address] [phone] (office) [phone] (fax)

## 2019-07-19 ENCOUNTER — Telehealth: Payer: Self-pay | Admitting: Cardiology

## 2019-07-19 NOTE — Telephone Encounter (Signed)
Spoke with Lissa Hoard and she states pt's son told her since pt's Amlodipine was increased 4/19 she has been feeling "crampy" all over, more pain in arms and shoulders, and tingly sensations like things are crawling all over her.  Son states the only improvement is that pt was sleeping until almost 3pm everyday but has been up by 8-9A the last couple of days.  Pt is not taking her afternoon naps and is much more agitated.  They have been having to give her extra Xanax to calm her.  Vitals today were 122/60, HR 62.  Hospice NP instructed Sonia to decrease Amlodipine back to 5mg  QD today.  Sonia states pt's BP is never elevated when she goes out to see the pt.  Advised I will send to Dr. for review and advisement.    Sherri- Elberta Fortis said she is assigned to triage tomorrow from 1p-5p, so if you wanted to wait until then to call, you would get her directly.

## 2019-07-19 NOTE — Telephone Encounter (Signed)
   Pt c/o medication issue:  1. Name of Medication: amLODipine (NORVASC) 10 MG tablet  2. How are you currently taking this medication (dosage and times per day)? Take 1 tablet (10 mg total) by mouth daily.  3. Are you having a reaction (difficulty breathing--STAT)? Increase anxiety, fatigue, muscle weakness  4. What is your medication issue? Sonia from [facility] calling, she said, since Dr.Camnitz increase pt's amlodipine to 10 mg the pt is experiencing Fatigue, increase anxiety, muscle weakness/cramps and couldn't sleep at night. Lissa Hoard said the NP there decrease the dosage to 5 mg daily. They would like to know Dr. Elberta Fortis recommendation  Please call

## 2019-07-20 ENCOUNTER — Telehealth: Payer: Self-pay | Admitting: Cardiology

## 2019-07-20 NOTE — Telephone Encounter (Signed)
   Went to chart to check who called Lamar Laundry, transferred call to [facility]

## 2019-07-20 NOTE — Telephone Encounter (Signed)
lmtcb

## 2019-07-20 NOTE — Telephone Encounter (Signed)
Hospice nurse returned my call.  Reports pt cannot tolerate more than 5mg  Amlodipine - family reports she "swells up" and experiences more pain.  SBPs avg 120s at home all the time, and elevated rate at OV d/t pain level that day. Informed ok to reduce Amlodipine back to 5mg , per Dr. . Will call if elevated BPs begin.  She also reports pt waking up earlier than normal since HRs increased to 60 (last OV device setting changed/increased to 60). Advised to continue to monitor and call office if pt experiences issues with normals ADLs d/t HR of 60 and we will address whether to return to previous device HR settings. Advised that this increase shouldn't affect her negatively but to let know if concerned. RN agreeable to plan and will inform pt & family.

## 2019-07-30 ENCOUNTER — Encounter: Payer: Medicare Other | Admitting: Cardiology

## 2019-08-14 ENCOUNTER — Ambulatory Visit (INDEPENDENT_AMBULATORY_CARE_PROVIDER_SITE_OTHER): Admitting: *Deleted

## 2019-08-14 DIAGNOSIS — I495 Sick sinus syndrome: Secondary | ICD-10-CM

## 2019-08-14 LAB — CUP PACEART REMOTE DEVICE CHECK
Battery Remaining Longevity: 113 mo
Battery Remaining Percentage: 95.5 %
Battery Voltage: 3.01 V
Brady Statistic AP VP Percent: 27 %
Brady Statistic AP VS Percent: 56 %
Brady Statistic AS VP Percent: 1 %
Brady Statistic AS VS Percent: 16 %
Brady Statistic RA Percent Paced: 83 %
Brady Statistic RV Percent Paced: 28 %
Date Time Interrogation Session: 20210518020026
Implantable Lead Implant Date: 20200817
Implantable Lead Implant Date: 20200817
Implantable Lead Location: 753859
Implantable Lead Location: 753860
Implantable Pulse Generator Implant Date: 20200817
Lead Channel Impedance Value: 440 Ohm
Lead Channel Impedance Value: 510 Ohm
Lead Channel Pacing Threshold Amplitude: 0.625 V
Lead Channel Pacing Threshold Amplitude: 1 V
Lead Channel Pacing Threshold Pulse Width: 0.5 ms
Lead Channel Pacing Threshold Pulse Width: 0.5 ms
Lead Channel Sensing Intrinsic Amplitude: 0.4 mV
Lead Channel Sensing Intrinsic Amplitude: 10.6 mV
Lead Channel Setting Pacing Amplitude: 0.875
Lead Channel Setting Pacing Amplitude: 2 V
Lead Channel Setting Pacing Pulse Width: 0.5 ms
Lead Channel Setting Sensing Sensitivity: 2 mV
Pulse Gen Model: 2272
Pulse Gen Serial Number: 9151060

## 2019-08-15 NOTE — Progress Notes (Signed)
Remote pacemaker transmission.   

## 2019-10-09 DIAGNOSIS — R0902 Hypoxemia: Secondary | ICD-10-CM | POA: Diagnosis not present

## 2019-10-09 DIAGNOSIS — Z7401 Bed confinement status: Secondary | ICD-10-CM | POA: Diagnosis not present

## 2019-10-09 DIAGNOSIS — I959 Hypotension, unspecified: Secondary | ICD-10-CM | POA: Diagnosis not present

## 2019-10-09 DIAGNOSIS — R52 Pain, unspecified: Secondary | ICD-10-CM | POA: Diagnosis not present

## 2019-10-09 DIAGNOSIS — R404 Transient alteration of awareness: Secondary | ICD-10-CM | POA: Diagnosis not present

## 2019-10-13 DIAGNOSIS — N390 Urinary tract infection, site not specified: Secondary | ICD-10-CM | POA: Diagnosis not present

## 2019-10-28 DEATH — deceased

## 2021-01-15 IMAGING — DX CHEST  1 VIEW
1 series · 1 of 1 positions shown · non-contrast
Comparison: 11/10/2018 and chest CT 08/27/2018

CLINICAL DATA: [AGE] with hemoptysis.

EXAM:
CHEST  1 VIEW

[chest ap]
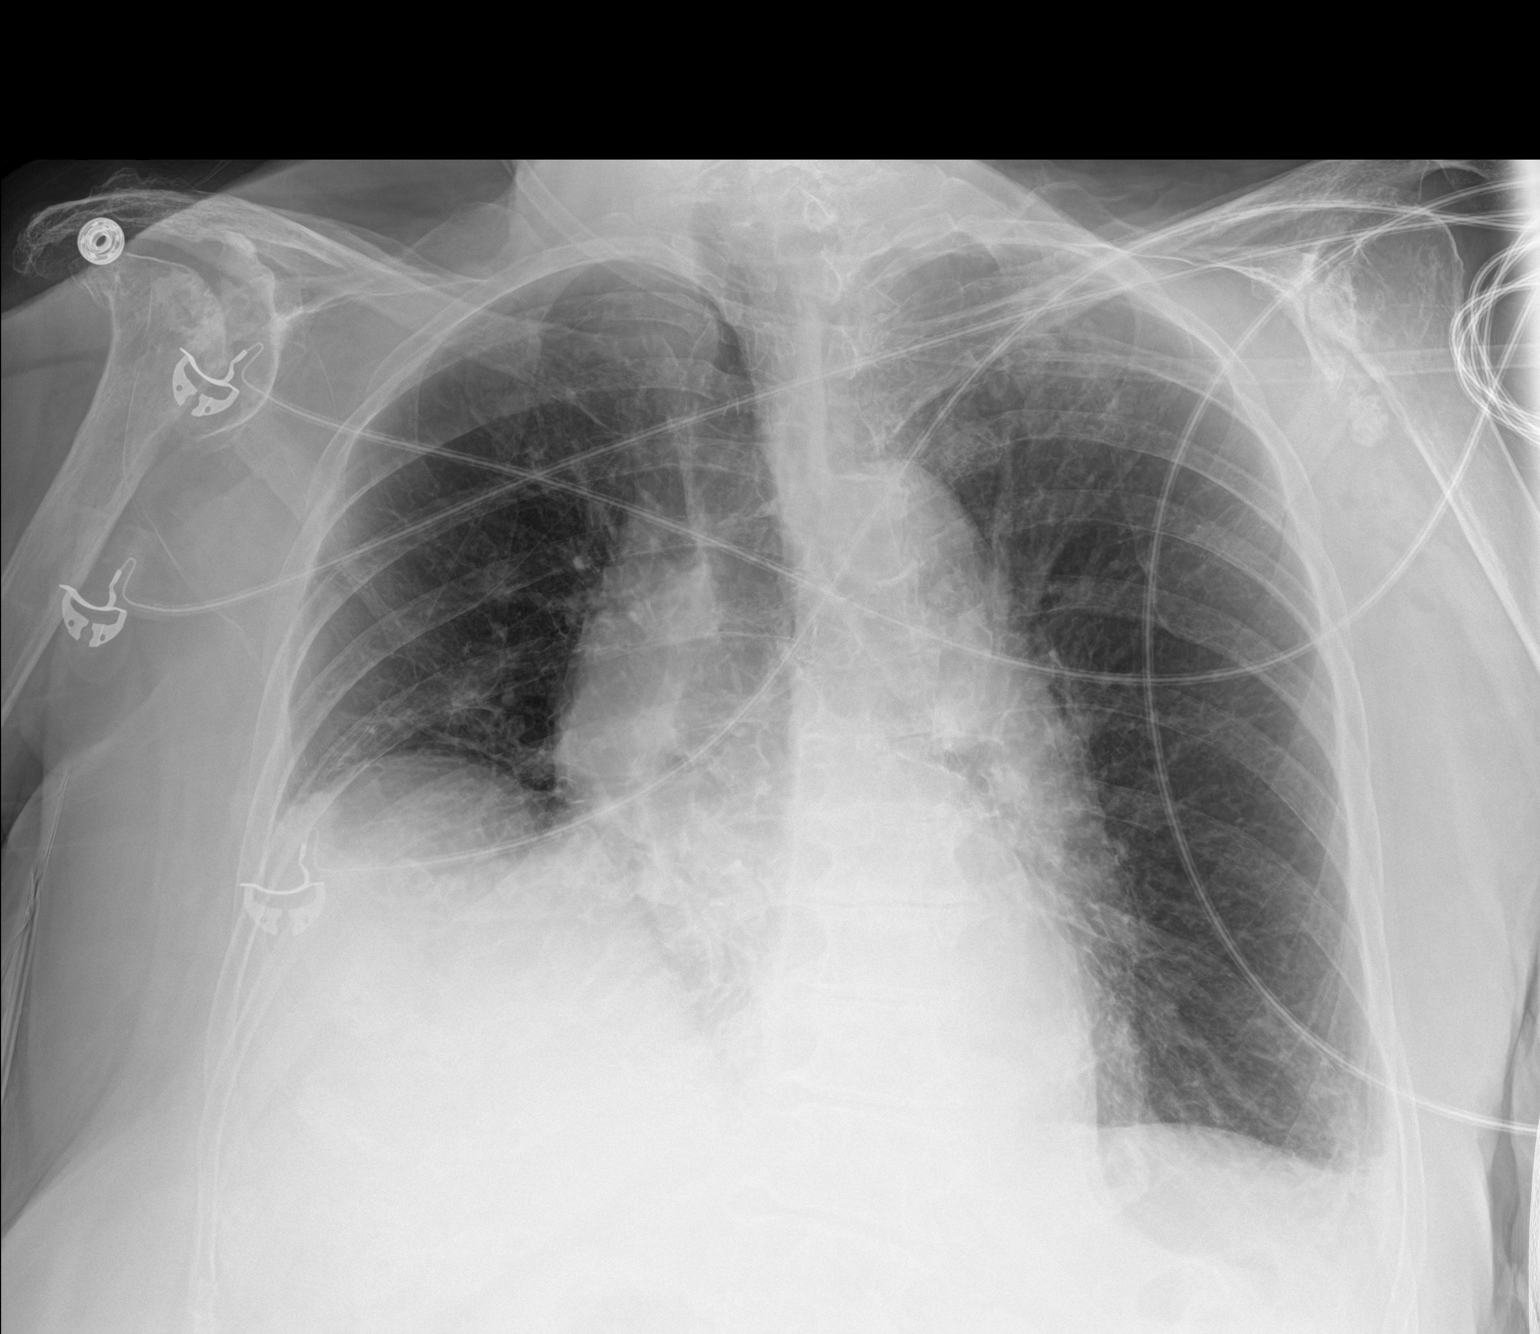

[1 of 1 positions shown; findings below may reference images not displayed]

FINDINGS: Stable elevation of the right hemidiaphragm. Heart and mediastinum
are stable. Few densities at the left costophrenic angle are
probably chronic but could represent atelectasis. No significant
airspace disease or consolidation. Negative for a pneumothorax.
IMPRESSION: 1. No acute cardiopulmonary disease.
2. Chronic elevation of the right hemidiaphragm.

## 2021-01-15 IMAGING — DX PORTABLE CHEST - 1 VIEW
1 series · 1 of 1 positions shown · non-contrast
Comparison: Radiographs August 27, 2018.

CLINICAL DATA: Hemoptysis.

EXAM:
PORTABLE CHEST 1 VIEW

[chest ap]
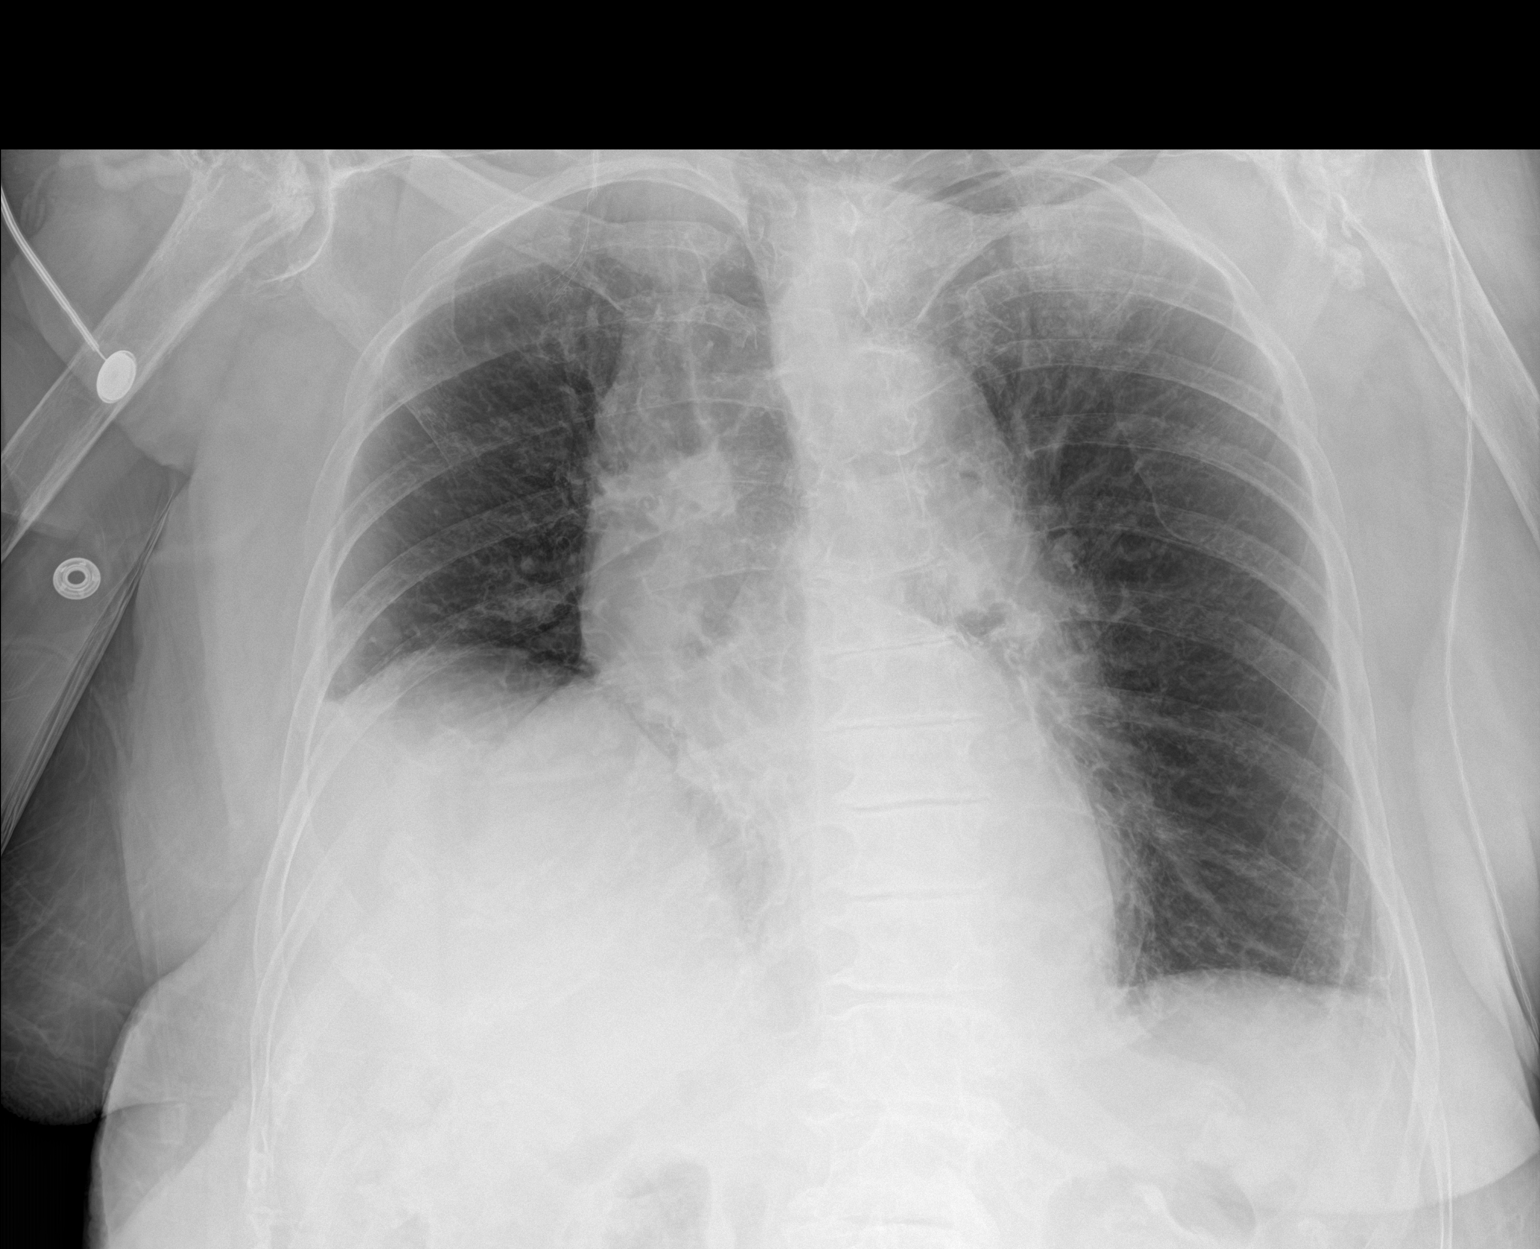

[1 of 1 positions shown; findings below may reference images not displayed]

FINDINGS: Stable cardiomediastinal silhouette. No pneumothorax is noted.
Stable elevated right hemidiaphragm is noted. No significant
pulmonary disease is noted. No significant pleural effusion is
noted. Degenerative changes are seen involving both glenohumeral
joints.
IMPRESSION: No active disease.

## 2021-01-19 IMAGING — CR CHEST - 2 VIEW
2 series · 2 of 2 positions shown · non-contrast
Comparison: 11/11/2018

CLINICAL DATA: Pacer placement

EXAM:
CHEST - 2 VIEW

[chest ap]
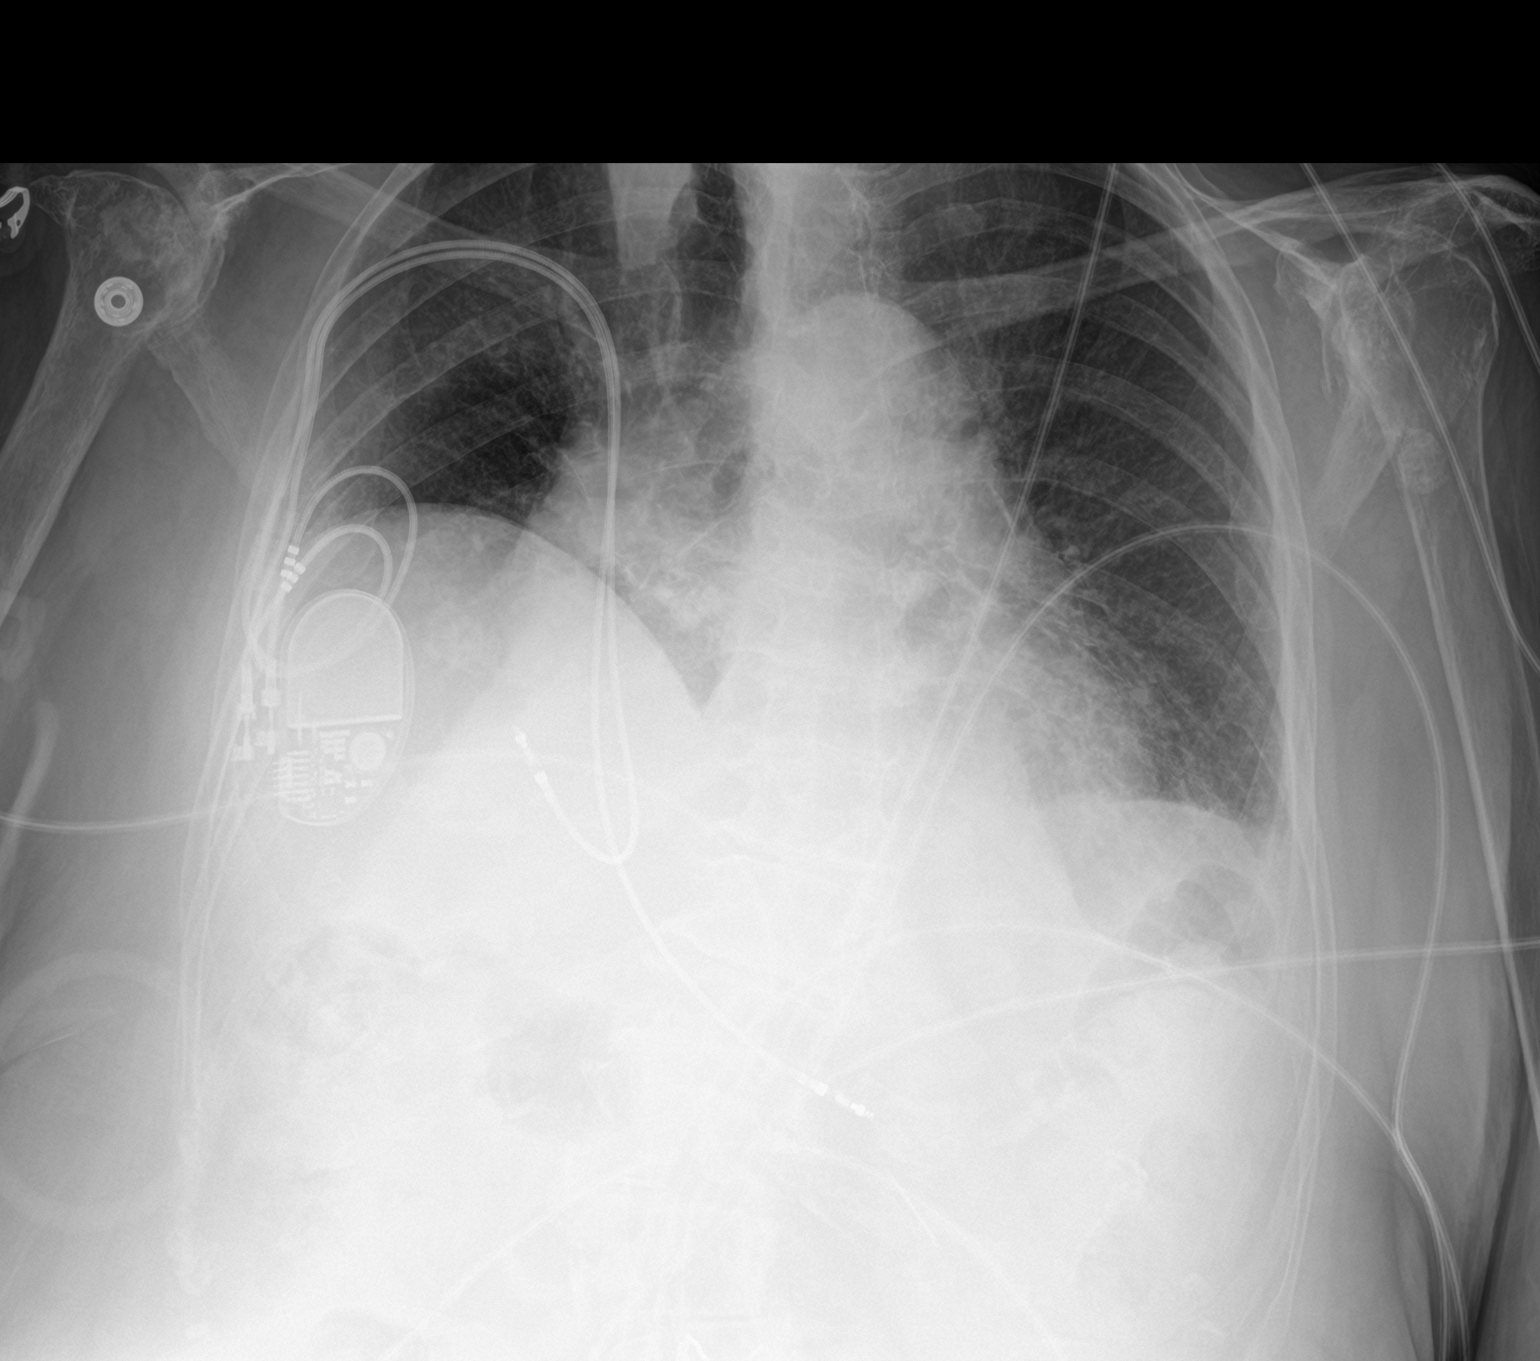

[chest lat]
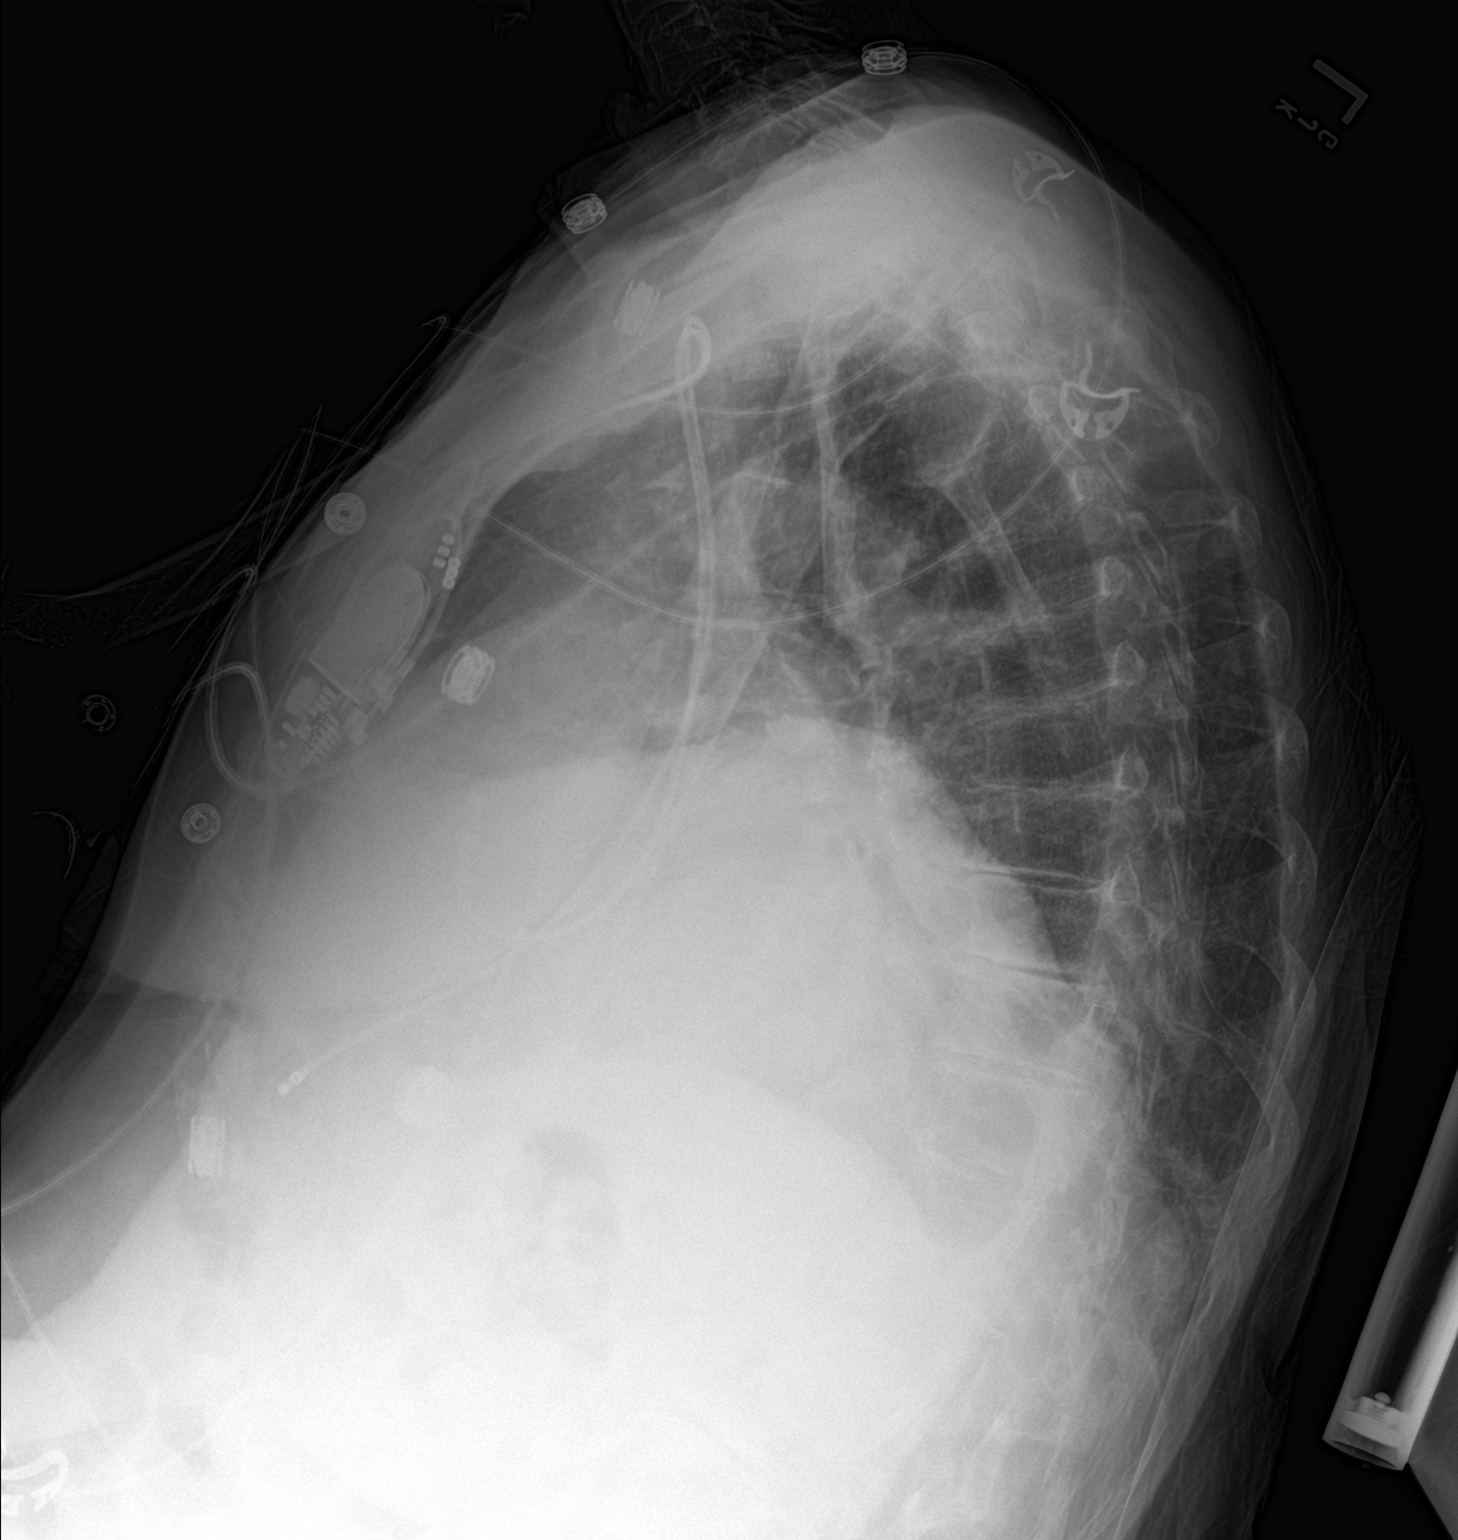

[2 of 2 positions shown; findings below may reference images not displayed]

FINDINGS: Right pacer has been placed with leads in the right atrium and right
ventricle. No pneumothorax. Stable elevation of the right
hemidiaphragm. Cardiomegaly. No confluent opacities or effusions.
IMPRESSION: Placement of right side pacer without pneumothorax.

Stable elevation of the right hemidiaphragm.

Stable cardiomegaly.
